# Patient Record
Sex: Female | Born: 1935 | Race: White | Hispanic: No | Marital: Married | Smoking: Never smoker
Health system: Southern US, Community
[De-identification: ages and names within clinical notes are randomized; demographics above are authoritative.]

## PROBLEM LIST (undated history)

## (undated) DIAGNOSIS — F419 Anxiety disorder, unspecified: Secondary | ICD-10-CM

## (undated) DIAGNOSIS — K219 Gastro-esophageal reflux disease without esophagitis: Secondary | ICD-10-CM

## (undated) DIAGNOSIS — F329 Major depressive disorder, single episode, unspecified: Secondary | ICD-10-CM

## (undated) DIAGNOSIS — M1990 Unspecified osteoarthritis, unspecified site: Secondary | ICD-10-CM

## (undated) DIAGNOSIS — D649 Anemia, unspecified: Secondary | ICD-10-CM

## (undated) DIAGNOSIS — I8390 Asymptomatic varicose veins of unspecified lower extremity: Secondary | ICD-10-CM

## (undated) DIAGNOSIS — E119 Type 2 diabetes mellitus without complications: Secondary | ICD-10-CM

## (undated) DIAGNOSIS — E785 Hyperlipidemia, unspecified: Secondary | ICD-10-CM

## (undated) DIAGNOSIS — T7840XA Allergy, unspecified, initial encounter: Secondary | ICD-10-CM

## (undated) DIAGNOSIS — K591 Functional diarrhea: Secondary | ICD-10-CM

## (undated) DIAGNOSIS — M79605 Pain in left leg: Secondary | ICD-10-CM

## (undated) DIAGNOSIS — E559 Vitamin D deficiency, unspecified: Secondary | ICD-10-CM

## (undated) DIAGNOSIS — I10 Essential (primary) hypertension: Secondary | ICD-10-CM

## (undated) DIAGNOSIS — E1142 Type 2 diabetes mellitus with diabetic polyneuropathy: Secondary | ICD-10-CM

## (undated) DIAGNOSIS — R609 Edema, unspecified: Secondary | ICD-10-CM

## (undated) HISTORY — PX: ABDOMINAL HYSTERECTOMY: SHX81

## (undated) HISTORY — DX: Gastro-esophageal reflux disease without esophagitis: K21.9

## (undated) HISTORY — PX: SPINE SURGERY: SHX786

## (undated) HISTORY — DX: Essential (primary) hypertension: I10

## (undated) HISTORY — DX: Type 2 diabetes mellitus without complications: E11.9

## (undated) HISTORY — DX: Asymptomatic varicose veins of unspecified lower extremity: I83.90

## (undated) HISTORY — DX: Anemia, unspecified: D64.9

## (undated) HISTORY — PX: CHOLECYSTECTOMY: SHX55

## (undated) HISTORY — DX: Hyperlipidemia, unspecified: E78.5

## (undated) HISTORY — DX: Major depressive disorder, single episode, unspecified: F32.9

## (undated) HISTORY — DX: Pain in left leg: M79.605

## (undated) HISTORY — DX: Vitamin D deficiency, unspecified: E55.9

## (undated) HISTORY — PX: LASER ABLATION: SHX1947

## (undated) HISTORY — DX: Allergy, unspecified, initial encounter: T78.40XA

## (undated) HISTORY — DX: Type 2 diabetes mellitus with diabetic polyneuropathy: E11.42

## (undated) HISTORY — DX: Unspecified osteoarthritis, unspecified site: M19.90

## (undated) HISTORY — DX: Edema, unspecified: R60.9

## (undated) HISTORY — DX: Anxiety disorder, unspecified: F41.9

## (undated) HISTORY — DX: Functional diarrhea: K59.1

---

## 1999-04-14 ENCOUNTER — Encounter: Admission: RE | Admit: 1999-04-14 | Discharge: 1999-04-14 | Payer: Self-pay | Admitting: Family Medicine

## 1999-04-14 ENCOUNTER — Encounter: Payer: Self-pay | Admitting: Family Medicine

## 1999-04-29 ENCOUNTER — Encounter: Admission: RE | Admit: 1999-04-29 | Discharge: 1999-04-29 | Payer: Self-pay | Admitting: Family Medicine

## 1999-04-29 ENCOUNTER — Encounter: Payer: Self-pay | Admitting: Family Medicine

## 1999-08-12 ENCOUNTER — Encounter: Payer: Self-pay | Admitting: Family Medicine

## 1999-08-12 ENCOUNTER — Encounter: Admission: RE | Admit: 1999-08-12 | Discharge: 1999-08-12 | Payer: Self-pay | Admitting: Family Medicine

## 1999-08-18 ENCOUNTER — Encounter: Admission: RE | Admit: 1999-08-18 | Discharge: 1999-08-18 | Payer: Self-pay | Admitting: Family Medicine

## 1999-08-18 ENCOUNTER — Encounter: Payer: Self-pay | Admitting: Family Medicine

## 1999-09-15 ENCOUNTER — Encounter: Payer: Self-pay | Admitting: Neurosurgery

## 1999-09-19 ENCOUNTER — Encounter: Payer: Self-pay | Admitting: Neurosurgery

## 1999-09-19 ENCOUNTER — Inpatient Hospital Stay (HOSPITAL_COMMUNITY): Admission: RE | Admit: 1999-09-19 | Discharge: 1999-09-20 | Payer: Self-pay | Admitting: Neurosurgery

## 2001-02-20 ENCOUNTER — Encounter: Payer: Self-pay | Admitting: Family Medicine

## 2001-02-20 ENCOUNTER — Encounter: Admission: RE | Admit: 2001-02-20 | Discharge: 2001-02-20 | Payer: Self-pay | Admitting: Family Medicine

## 2001-02-27 ENCOUNTER — Encounter: Payer: Self-pay | Admitting: Family Medicine

## 2001-02-27 ENCOUNTER — Ambulatory Visit (HOSPITAL_COMMUNITY): Admission: RE | Admit: 2001-02-27 | Discharge: 2001-02-27 | Payer: Self-pay | Admitting: Family Medicine

## 2004-12-15 ENCOUNTER — Encounter: Admission: RE | Admit: 2004-12-15 | Discharge: 2004-12-15 | Payer: Self-pay | Admitting: Family Medicine

## 2004-12-25 ENCOUNTER — Emergency Department (HOSPITAL_COMMUNITY): Admission: EM | Admit: 2004-12-25 | Discharge: 2004-12-25 | Payer: Self-pay | Admitting: Emergency Medicine

## 2005-04-03 ENCOUNTER — Ambulatory Visit: Payer: Self-pay | Admitting: Gastroenterology

## 2005-04-05 ENCOUNTER — Ambulatory Visit: Payer: Self-pay | Admitting: Cardiology

## 2005-05-01 ENCOUNTER — Ambulatory Visit: Payer: Self-pay | Admitting: Gastroenterology

## 2005-06-29 ENCOUNTER — Encounter: Admission: RE | Admit: 2005-06-29 | Discharge: 2005-06-29 | Payer: Self-pay | Admitting: Family Medicine

## 2005-11-22 ENCOUNTER — Encounter: Admission: RE | Admit: 2005-11-22 | Discharge: 2005-11-22 | Payer: Self-pay | Admitting: Family Medicine

## 2006-09-23 IMAGING — CT CT ABDOMEN W/ CM
1 of 4 series · 13 of 32 positions shown, 18 images · IV contrast (APPLIED)
Comparison: none

CLINICAL DATA: right upper quadrant pain; feels knot on right near cholecystectomy incision
ABDOMEN CT WITH CONTRAST:
TECHNIQUE: Multidetector CT imaging of the abdomen was performed following the standard protocol during bolus administration of intravenous contrast.
Contrast:  100 cc Omnipaque 300
TECHNIQUE: Multidetector CT imaging of the pelvis was performed following the standard protocol during bolus administration of intravenous contrast.

[Series 2: abd_pel 5.0 b30f st · axial · 0.71mm/px · z∈[-436,-86]mm · 13 of 82 slices shown, 18 images]
[im 6/82  soft-tissue]
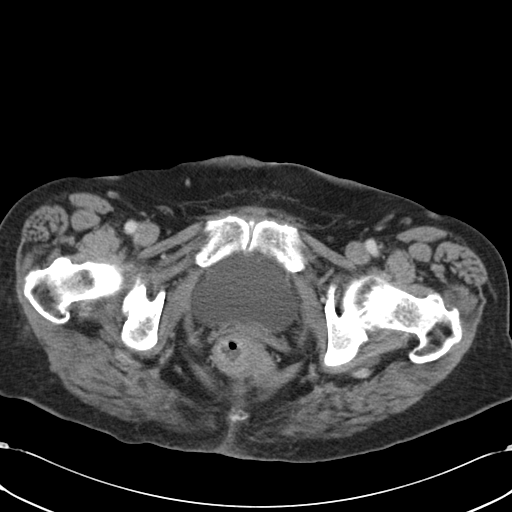
[im 6/82  bone]
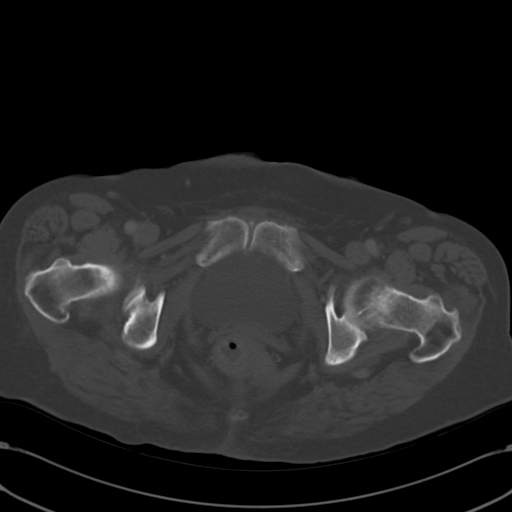
[im 11/82  soft-tissue]
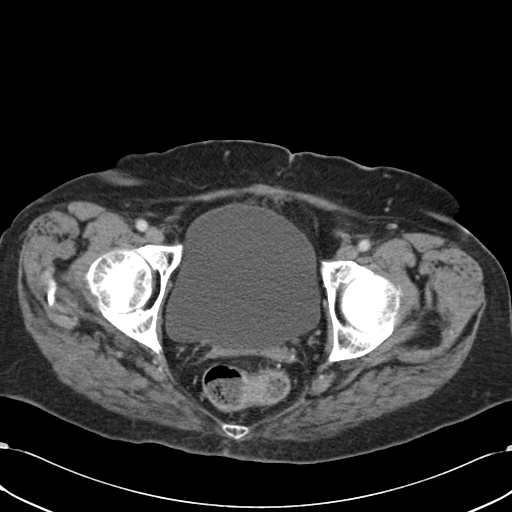
[im 21/82  soft-tissue]
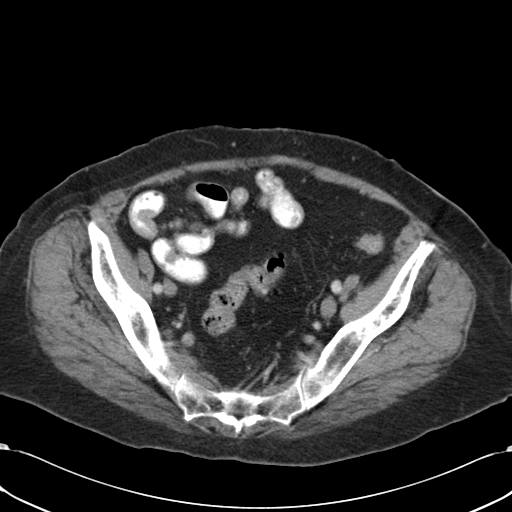
[im 26/82  soft-tissue]
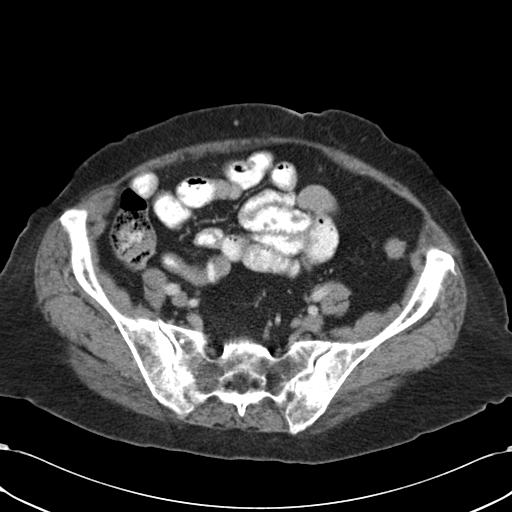
[im 31/82  soft-tissue]
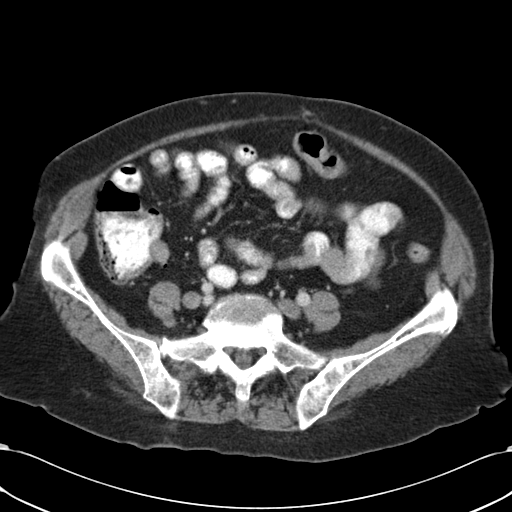
[im 36/82  soft-tissue]
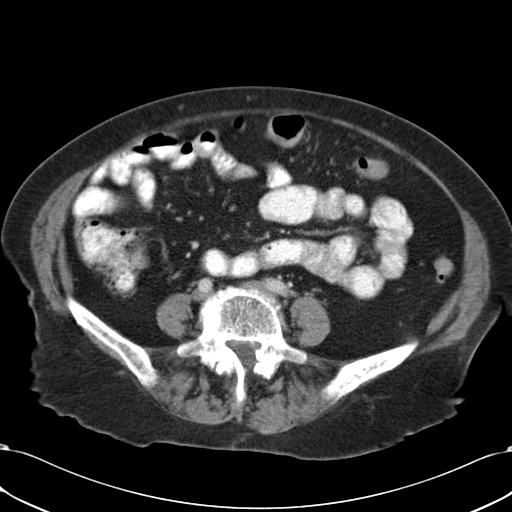
[im 46/82  soft-tissue]
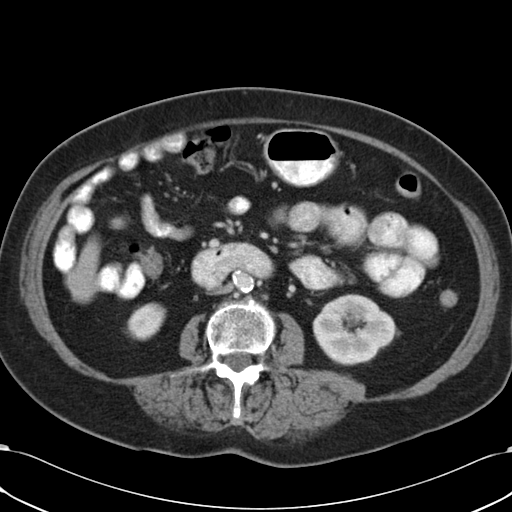
[im 51/82  soft-tissue]
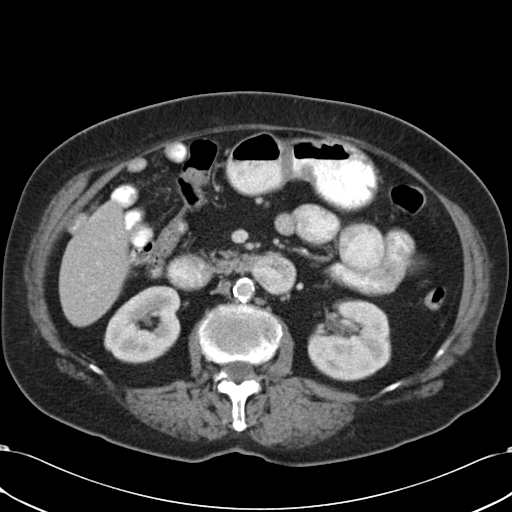
[im 56/82  soft-tissue]
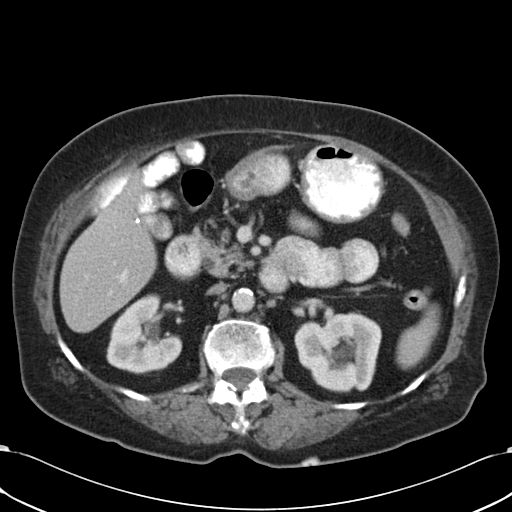
[im 56/82  bone]
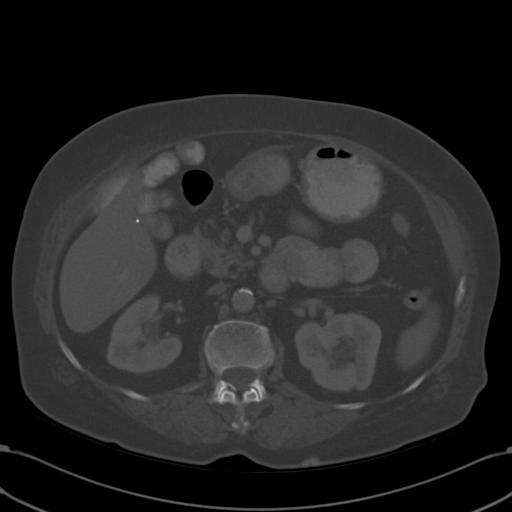
[im 61/82  soft-tissue]
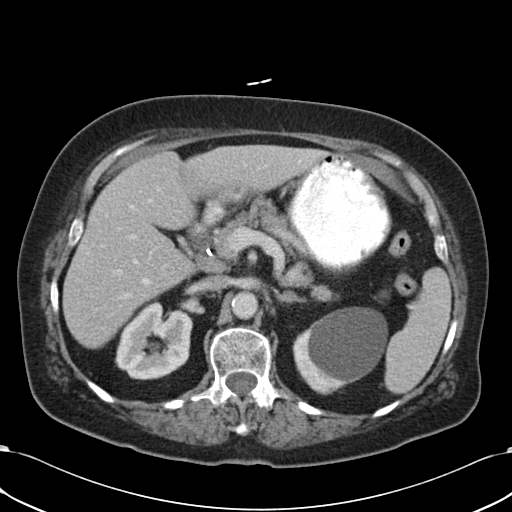
[im 61/82  lung]
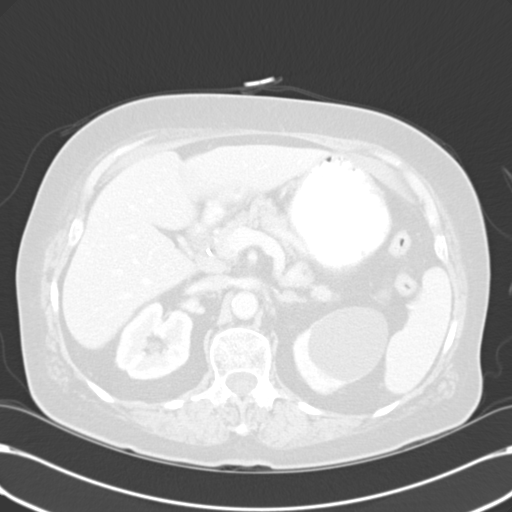
[im 66/82  lung]
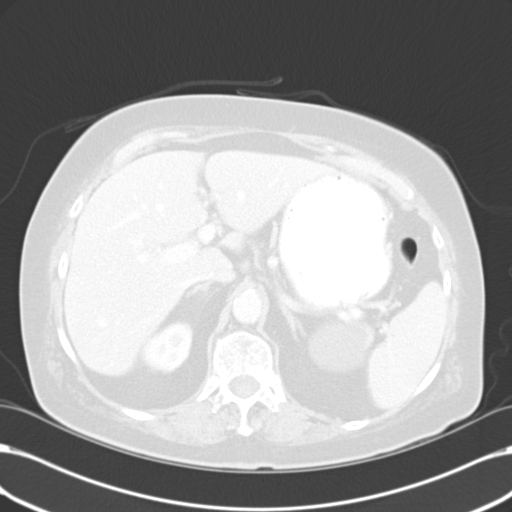
[im 71/82  soft-tissue]
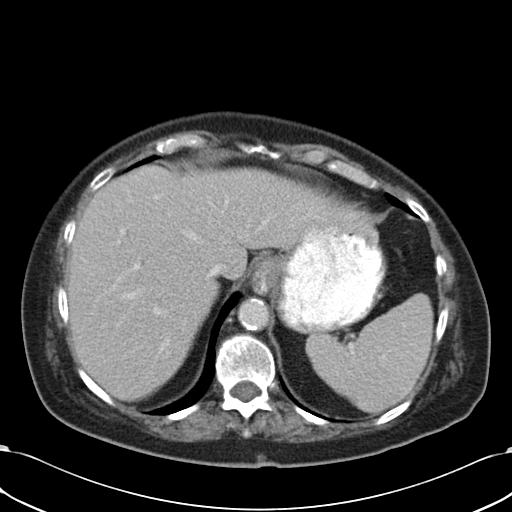
[im 71/82  lung]
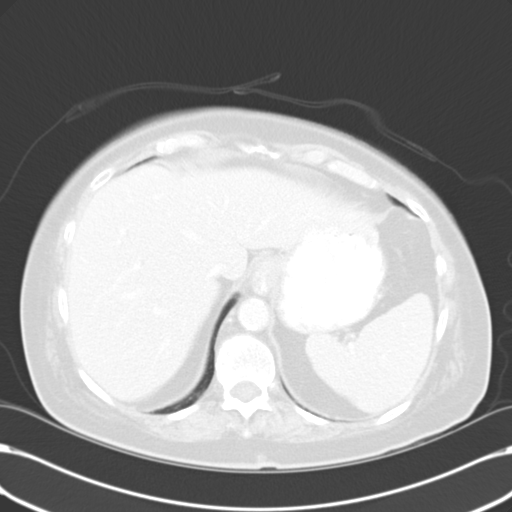
[im 76/82  soft-tissue]
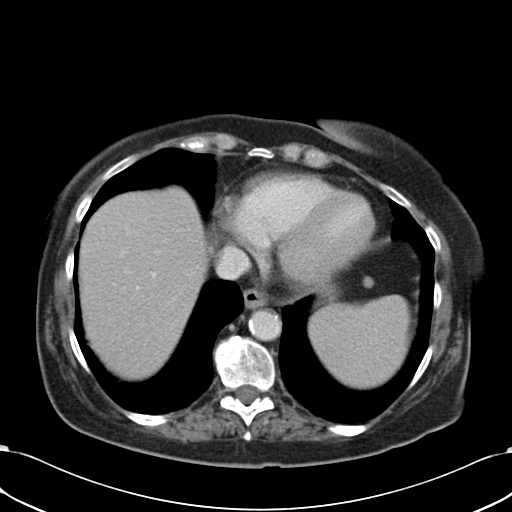
[im 76/82  lung]
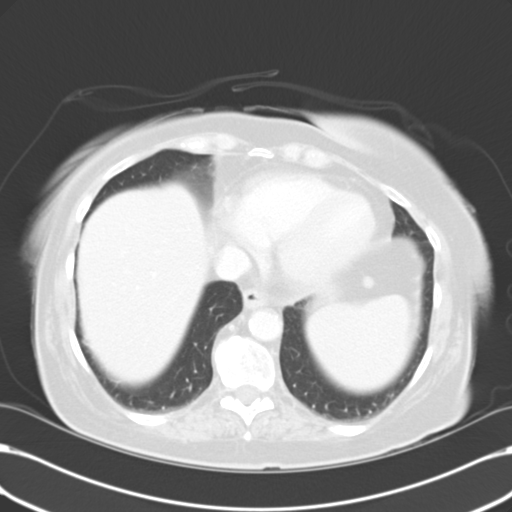

[13 of 32 positions shown; findings below may reference images not displayed]

FINDINGS: The lung bases are clear.  The liver enhances normally with no focal abnormality and no ductal dilatation is seen.  Surgical clips are present from prior cholecystectomy.  The pancreas is normal in size with normal-appearing pancreatic fat planes.  The left adrenal gland is normal with a probable right adrenal adenoma of 21 x 15mm on image # 14 with an attention of 20 Hounsfield units.  The spleen is normal in size.  A 6 x 5cm cyst emanates from the upper pole of the left kidney.  No renal calculi are seen and there is no evidence of hydronephrosis.  The abdominal aorta is normal in caliber.  No adenopathy is noted.
IMPRESSION: 1.  Incidental 21 x 15mm right adrenal adenoma.  
2.  6cm upper pole left renal cyst.  
3.  Prior cholecystectomy.  
PELVIS CT WITH CONTRAST:
FINDINGS: Scans were continued through the pelvis after oral and IV contrast media were given.  The terminal ileum appears normal.  The appendix has previously been removed.  There is minimal prominence of mucosa of distal ileum.  This may simply be due to contraction and be within normal limits, but if further assessment is necessary CT enterography may be helpful.  Urinary bladder is unremarkable.  There are rectosigmoid colonic diverticula present.  No free fluid is seen within the pelvis.
IMPRESSION: 1.  Rectosigmoid colonic diverticula.
2. Slight prominence of the mucosa of distal ileal small bowel of questionable significance possibly due to contraction of small bowel.  If further assessment is necessary suggest CT enterography.

## 2007-05-13 IMAGING — NM NM THYROID UPTAKE SINGLE (24 HR)
1 series · 1 of 1 positions shown · non-contrast
Comparison: none

CLINICAL DATA: 25 to 30 pound weight loss.  TSH 0.137.  Probable multinodular goiter on thyroid ultrasound, 11/22/05.
NUCLEAR MEDICINE THYROID UPTAKE SINGLE ? 45 MINUTES, 11/23/05:
TECHNIQUE: Following orally administered I131, thyroid radioiodine uptake was calculated at 24 hours
Radiopharmaceuticals:  14.25 uCi I131 sodium iodide p.o.

[st statics, dual detec · 1 of 1 slices shown]
[im 1/1]
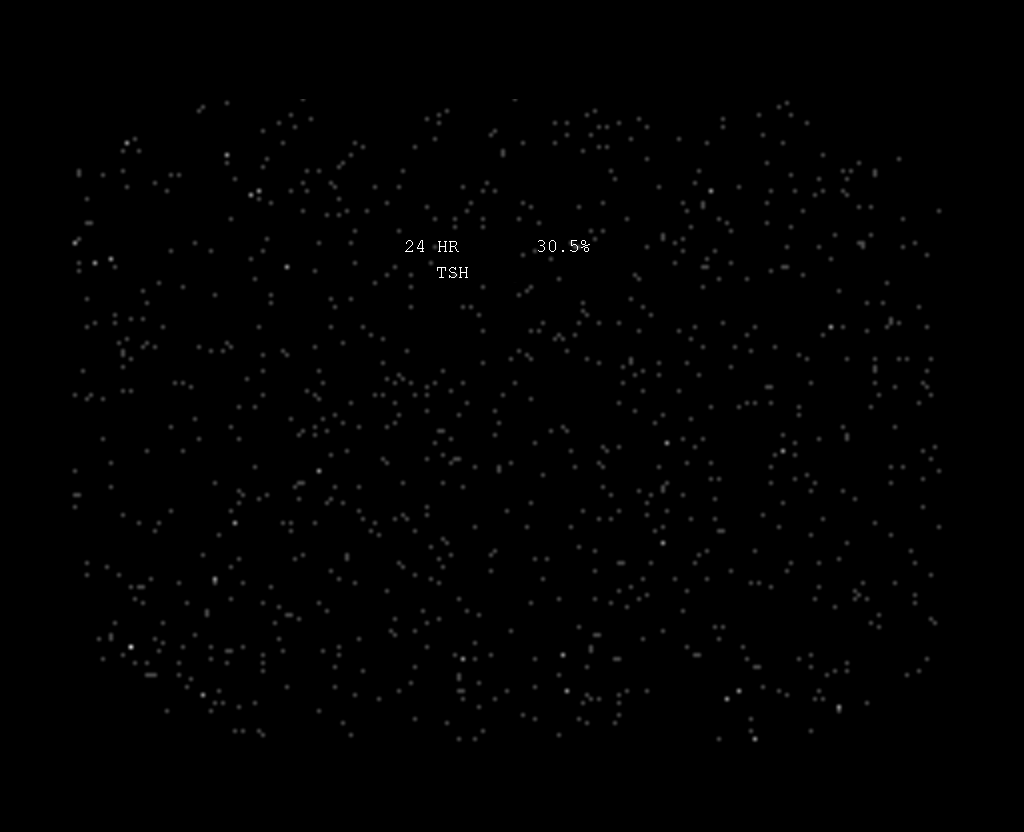

[1 of 1 positions shown; findings below may reference images not displayed]

FINDINGS: 24 hour thyroid uptake calculates to 30.5% which is within upper normal range.
IMPRESSION: 24 hour REGENT thyroid uptake 30.5% (upper normal).

## 2007-12-06 ENCOUNTER — Encounter: Admission: RE | Admit: 2007-12-06 | Discharge: 2007-12-06 | Payer: Self-pay | Admitting: Family Medicine

## 2009-03-09 ENCOUNTER — Encounter (INDEPENDENT_AMBULATORY_CARE_PROVIDER_SITE_OTHER): Payer: Self-pay | Admitting: *Deleted

## 2009-07-19 ENCOUNTER — Telehealth (INDEPENDENT_AMBULATORY_CARE_PROVIDER_SITE_OTHER): Payer: Self-pay | Admitting: *Deleted

## 2010-07-21 NOTE — Progress Notes (Signed)
  Phone Note Other Incoming   Summary of Call: Patient's chart has been requested by [facility] on 07/19/09.  Chart requested to [place] copies/fax area.

## 2010-08-03 DIAGNOSIS — E559 Vitamin D deficiency, unspecified: Secondary | ICD-10-CM

## 2010-08-03 HISTORY — DX: Vitamin D deficiency, unspecified: E55.9

## 2010-11-03 DIAGNOSIS — F32A Depression, unspecified: Secondary | ICD-10-CM

## 2010-11-03 HISTORY — DX: Depression, unspecified: F32.A

## 2010-11-04 NOTE — H&P (Signed)
[place]  Patient:    Catherine Lyons, Catherine Lyons                      MRN: [id number] Adm. Date:  [id number] Attending:  Barton Fanny CC:         Hewitt Shorts, M.D.                         History and Physical  HISTORY OF PRESENT ILLNESS:  The patient is a 77 year old, right-handed white female who was evaluated for a left lumbar radiculopathy.  She had trouble off nd on with her back for 50 or more years.  She had been evaluated 11 years ago by y former partner, Dr. Hope Pigeon.  The patient says that about a month and one-half ago, she had one of her typical episodes of low back discomfort, but then about a month ago, the pain in her low back resolved, but she developed pain down her left lower extremity.  There was no particular cause that brought this about.  The pain went from the left buttock down into the thigh and leg and into the foot, and she found it interesting that she did not have any back pain associated with it. She took Darvocet which has tended to relieve the discomfort, but she has had to limit her activities.  She denies any numbness or paresthesias.  She says she does walk with a limp and has a sense of weakness in the left lower extremity.  The patient was evaluated with MRI scan which showed degenerative disk disease t multiple levels; however, there is a left L4-5 lumbar disk herniation that has migrated caudally behind the body of L5.  It is felt that this most likely represents an acute disk herniation and much less likely represents a synovial cyst.  PAST MEDICAL HISTORY:  Notable for a history of hypertension.  She uses atenolol and Lotensin.  History of diabetes for 20 years treated with Glucotrol XL. History of depression treated with Risperdal and Zoloft.  She does not describe any history of myocardial infarction, cancer, stroke, or lung disease.  She does describe a  history of hiatal  hernia.  PAST SURGICAL HISTORY:  Two cesarean section, D&Cs, cholecystectomy, breast biopsy x 3, hemorrhoid surgery, partial hysterectomy and then total hysterectomy.  ALLERGIES:  She reports allergy to DEMEROL and CODEINE.  CURRENT MEDICATIONS: 1. Atenolol 50 mg q.d. 2. Lotensin 40 mg q.d. 3. Glucotrol XL 10 mg q.d. 4. Premarin 0.625 mg q.d. 5. Zoloft 100 mg q.d. 6. Risperdal 0.5 mg q.h.s. 7. Darvocet-N 100 1 to 2 tablets p.o. q.4h. p.r.n. pain.  FAMILY HISTORY:  Mother died at age 51 of heart disease.  Her father died at age 65 of cancer.  He also had diabetes.  SOCIAL HISTORY:  The patient is married.  She does not work.  She does not smoke, drink alcoholic beverages, or have a history of substance abuse.  REVIEW OF SYSTEMS: Notable for those difficulties described in her History of Present Illness and Past Medical History but is otherwise unremarkable.  PHYSICAL EXAMINATION:  GENERAL:  The patient is a well-developed, well-nourished, white female in no acute distress.  VITAL SIGNS:  Temperature 98.2, pulse 76, blood pressure 174/80, respiratory rate 18.  Height is 5 feet 4 inches, weight 160 pounds.  LUNGS:  Clear to auscultation.  Symmetrical respiratory excursion.  HEART:  Regular rate and rhythm.  Normal S1 and S2.  There is no murmur.  ABDOMEN:  Soft, nontender.  Bowel sounds are present.  EXTREMITIES:  No clubbing, cyanosis, or edema.  VASCULAR:  Pulses 1 to 2 at dorsalis pedis and posterior tibial bilaterally. She has moderate varicosities, particularly in the left lower extremity.  MUSCULOSKELETAL:  No tenderness to palpation over the lumbar spinous process or  perilumbar musculature.  She is limited in forward flexion to about 6 degrees due to discomfort.  She is able to extend fairly well.  Straight leg raising on the  left at about 6 degrees because of radicular pain.  Straight leg raising is negative on the right.  NEUROLOGIC:  Weakness in the  left dorsiflexion at 4-/5, left extensor hallucis longus 2 to 3/5; however the remainder of the lower extremity musculature is 5/5 including the plantar flexors bilaterally as well as the right dorsiflexor and extensor hallucis longus.  Sensation is intact to pinprick in the lower extremities.  Reflexes are 1 at the quadriceps bilaterally.  The left gastrocnemius is absent.  The right gastrocnemius is 2.  Toes are downgoing bilaterally.  She has normal gait and stance.  IMPRESSION:  Acute left L5 radiculopathy related to an acute left L4-5 lumbar disk herniation with a fragment that has migrated caudally behind the body of L5 with associated significant weakness of the left dorsiflexor and extensor hallucis longus.  PLAN:  The patient will be admitted for a left L4-5 lumbar laminotomy and microdiskectomy.  We discussed alternatives to surgery, the typical length of surgery, hospitalization, recuperation, and the nature of the surgical procedure including its risks including the risk of infection, bleeding, possibility of transfusion, risk of nerve dysfunction, pain, weakness, or paresthesias, risk of recurrent disk herniation, possible need for further surgery, anesthetic risks f myocardial infarction, stroke, pneumonia, and death.  Understanding all of this, the patient does wish to proceed with surgery and is admitted for such. DD:  09/19/99 TD:  09/19/99 Job: [id number]

## 2010-11-04 NOTE — Op Note (Signed)
[place]  Patient:    Catherine Lyons, Catherine Lyons                      MRN: [id number] Proc. Date: 09/19/99 Adm. Date:  [id number] Disc. Date: [id number] Attending:  Barton Fanny CC:         Hewitt Shorts, M.D.                           Operative Report  PREOPERATIVE DIAGNOSIS:  Left L4-5 lumbar disk herniation.  POSTOPERATIVE DIAGNOSIS:  Left L4-5 lumbar disk herniation.  PROCEDURE:  Left L4-5 lumbar laminotomy and microdiskectomy.  SURGEON:  Hewitt Shorts, M.D.  ASSISTANT:  Dr. Roxan Hockey.  ANESTHESIA:  General endotracheal.  INDICATIONS:  The patient is a 77 year old woman who presented with a left L5 radiculopathy with significant dorsiflexion and EHL weakness, who was found by RI scan to have a left L4-5 lumbar disk herniation with a fragmented that had migrated caudally behind the body of the L5.  The decision was made to proceed with elective laminotomy and microdiskectomy.  DESCRIPTION OF PROCEDURE:  The patient was brought to the operating room and placed under general endotracheal anesthesia.  The patient was turned to a prone position and the lumbar region was prepped with Betadine soap solution, draped in a sterile fashion.  The midline was infiltrated with local anesthetic with epinephrine and local are was taped and the L4-5 level identified.  A midline incision was made, carried down through the subcutaneous tissue.  Bipolar cautery and electrocautery used to maintain hemostasis.  Dissection was carried down to the lumbar fascia which was incised from the left side of the midline in the paraspinal muscle with dissection of the spinous process and lamina in a subperiosteal fashion.  The L4-5 intralaminar space was identified and actually was taken to confirm this localization, and I proceeded with a laminotomy using the Midas Rex drill with  G12 130 bit and attachment for the Midas Rex and Kerrison punches.   The microscope was draped and brought into the field to provide identification, illumination and visualization and the remainder of the procedure was performed using microdissection technique.  The laminotomy was extended rostrally and caudally nd we were able to expose the lateral aspect of the thecal sac and the left L5 nerve root.  We first exposed in the axilla of the left L5 nerve root and found several fragments of degenerated disk material, which were removed and freed up. Additional fragments we then mobilized and removed.  We then exposed rostral to the nerve roots and further fragments were encountered and we could see where the annulus had been separated from the superior aspect of the L5 vertebral body. e entered the disk space and removed all loose fragments of the disk material from within the disk space and then the epidural space was more carefully examined to ensure that all fragments had been removed and that the thecal sac and nerve root were well decompressed.  In the end, all loose fragment disk material were removed from both disk space and the epidural space, and nerve root and thecal sac were  felt to be well decompressed; hemostasis was established with the use of bipolar cautery as well as Gelfoam soaked in thrombin; however, all the Gelfoam was removed prior to closure.  Once hemostasis established, we infused 2 cc of fentanyl and 80 mg of Depo-Medrol into the epidural  space and proceeded with closure.  The deep  fascia was closed with interrupted undyed 0 Vicryl sutures and the subcutaneous and subcuticular were closed with interrupted inverted 2-0 Vicryl sutures, then the  skin edges were approximately with Dermabond.  Following surgery, the patient was turned back to the supine position to be reversed from the anesthetic, extubated and was transferred to the recovery room for further care.  Estimated blood loss was less than 100 cc.  Sponge  and count were correct. DD:  09/19/99 TD:  09/19/99 Job: [id number]

## 2011-10-02 DIAGNOSIS — T7840XA Allergy, unspecified, initial encounter: Secondary | ICD-10-CM

## 2011-10-02 DIAGNOSIS — R609 Edema, unspecified: Secondary | ICD-10-CM

## 2011-10-02 DIAGNOSIS — E1142 Type 2 diabetes mellitus with diabetic polyneuropathy: Secondary | ICD-10-CM

## 2011-10-02 HISTORY — DX: Type 2 diabetes mellitus with diabetic polyneuropathy: E11.42

## 2011-10-02 HISTORY — DX: Edema, unspecified: R60.9

## 2011-10-02 HISTORY — DX: Allergy, unspecified, initial encounter: T78.40XA

## 2012-01-25 DIAGNOSIS — I10 Essential (primary) hypertension: Secondary | ICD-10-CM

## 2012-01-25 HISTORY — DX: Essential (primary) hypertension: I10

## 2012-04-30 DIAGNOSIS — K591 Functional diarrhea: Secondary | ICD-10-CM

## 2012-04-30 DIAGNOSIS — K219 Gastro-esophageal reflux disease without esophagitis: Secondary | ICD-10-CM

## 2012-04-30 DIAGNOSIS — M1990 Unspecified osteoarthritis, unspecified site: Secondary | ICD-10-CM

## 2012-04-30 HISTORY — DX: Functional diarrhea: K59.1

## 2012-04-30 HISTORY — DX: Unspecified osteoarthritis, unspecified site: M19.90

## 2012-04-30 HISTORY — DX: Gastro-esophageal reflux disease without esophagitis: K21.9

## 2012-05-10 DIAGNOSIS — E785 Hyperlipidemia, unspecified: Secondary | ICD-10-CM

## 2012-05-10 DIAGNOSIS — E119 Type 2 diabetes mellitus without complications: Secondary | ICD-10-CM

## 2012-05-10 HISTORY — DX: Hyperlipidemia, unspecified: E78.5

## 2012-05-10 HISTORY — DX: Type 2 diabetes mellitus without complications: E11.9

## 2012-07-20 DIAGNOSIS — M79605 Pain in left leg: Secondary | ICD-10-CM

## 2012-07-20 HISTORY — DX: Pain in left leg: M79.605

## 2012-08-16 ENCOUNTER — Other Ambulatory Visit: Payer: Self-pay | Admitting: *Deleted

## 2012-09-06 ENCOUNTER — Encounter: Payer: Self-pay | Admitting: Surgery

## 2012-09-09 ENCOUNTER — Encounter: Payer: Self-pay | Admitting: Surgery

## 2012-10-18 ENCOUNTER — Encounter: Payer: Self-pay | Admitting: Vascular Surgery

## 2012-10-28 ENCOUNTER — Encounter: Payer: Self-pay | Admitting: Surgery

## 2012-10-30 ENCOUNTER — Encounter: Payer: Self-pay | Admitting: Vascular Surgery

## 2012-10-31 ENCOUNTER — Ambulatory Visit (INDEPENDENT_AMBULATORY_CARE_PROVIDER_SITE_OTHER): Payer: Medicare Other | Admitting: Vascular Surgery

## 2012-10-31 ENCOUNTER — Encounter: Payer: Self-pay | Admitting: Vascular Surgery

## 2012-10-31 ENCOUNTER — Encounter (INDEPENDENT_AMBULATORY_CARE_PROVIDER_SITE_OTHER): Payer: Medicare Other | Admitting: *Deleted

## 2012-10-31 VITALS — BP 166/60 | HR 69 | Resp 16 | Ht 65.0 in | Wt 154.0 lb

## 2012-10-31 DIAGNOSIS — I83893 Varicose veins of bilateral lower extremities with other complications: Secondary | ICD-10-CM

## 2012-10-31 DIAGNOSIS — M79609 Pain in unspecified limb: Secondary | ICD-10-CM

## 2012-10-31 NOTE — Progress Notes (Signed)
[facility] [place]   Patient name: Catherine Lyons MRN: [id number] DOB: [birth year] Sex: female   Referred by: [place]  Reason for referral:  Chief Complaint  Patient presents with  . Varicose Veins    NEW B/L VV PAIN, SWELLING    HISTORY OF PRESENT ILLNESS: The patient is a very active 79 year old white female with a history of prior laser ablation of her left great saphenous vein an outlying facility approximately 8 years ago. She reportedly at that time had large varicosities and had a good result. Over the past 2 months she began having pain in her left leg. This was quite uncomfortable to her and would awaken her at night and she had a very difficult time achieving any rest. She tried multiple home remedies and elevation with some improvement. She does report that A. aspirin product has improved her discomfort. At its worse she had 3 kn were buried for sleep. She reports this pain is now nearly completely resolved. He has very mild discomfort in the right leg. She reports this as an aching sensation throughout her entire leg and not specifically in the joint spaces themselves. She does have what sounds like neuropathy in her feet with pins and needles sensation chronically. He does not have any history of lower surety extremity tissue loss or venous ulceration. She does have degenerative disc disease with prior back procedures.  Past Medical History  Diagnosis Date  . Left leg pain Feb. 2014    and swelling  . Varicose veins     Left leg - pain and swelling  . Anxiety   . Depression 11/03/10  . Esophageal reflux 04/30/12  . Diabetes mellitus without complication 05/10/12  . Hyperlipidemia 05/10/12  . Functional diarrhea 04/30/12  . Allergy 10/02/11    Rhinitis  . Vitamin D deficiency 08/03/10  . Hypertension 01/25/12  . Edema 10/02/11  . Arthritis 04/30/12    Gout  . Polyneuropathy in diabetes 10/02/11  . Anemia 02/08/12 and 05/10/12    Past  Surgical History  Procedure Laterality Date  . Laser ablation Left     Years ago- Large varicosity   . Cholecystectomy    . Abdominal hysterectomy    . Spine surgery      Lumbar  spine X's 2    History   Social History  . Marital Status: Married    Spouse Name: N/A    Number of Children: N/A  . Years of Education: N/A   Occupational History  . Not on file.   Social History Main Topics  . Smoking status: Never Smoker   . Smokeless tobacco: Never Used  . Alcohol Use: No  . Drug Use: No  . Sexually Active: Not on file   Other Topics Concern  . Not on file   Social History Narrative  . No narrative on file    Family History  Problem Relation Age of Onset  . Heart disease Mother   . Diabetes Father     Allergies as of 10/31/2012 - Review Complete 10/31/2012  Allergen Reaction Noted  . Codeine sulfate Rash 10/18/2012  . Demerol (meperidine) Rash 10/18/2012    Current Outpatient Prescriptions on File Prior to Visit  Medication Sig Dispense Refill  . allopurinol (ZYLOPRIM) 100 MG tablet Take 100 mg by mouth daily.      Marland Kitchen ALPRAZolam (XANAX) 0.25 MG tablet Take 0.25 mg by mouth at bedtime as needed for sleep.      Marland Kitchen amLODipine (  NORVASC) 2.5 MG tablet Take 2.5 mg by mouth daily.      . beclomethasone (QVAR) 80 MCG/ACT inhaler Inhale 1 puff into the lungs as needed.      . cholestyramine (QUESTRAN) 4 G packet Take 1 packet by mouth 3 (three) times daily with meals.      Marland Kitchen FLUoxetine (PROZAC) 20 MG capsule Take 20 mg by mouth daily.      . furosemide (LASIX) 20 MG tablet Take 20 mg by mouth 2 (two) times daily.      Marland Kitchen glipiZIDE (GLUCOTROL) 10 MG tablet Take 10 mg by mouth 2 (two) times daily before a meal.      . hydrochlorothiazide (HYDRODIURIL) 25 MG tablet Take 25 mg by mouth daily.      Marland Kitchen LOSARTAN POTASSIUM PO Take 100 mg by mouth daily.      . meclizine (ANTIVERT) 25 MG tablet Take 25 mg by mouth 3 (three) times daily as needed.      . metFORMIN (GLUCOPHAGE) 500 MG  tablet Take 500 mg by mouth 2 (two) times daily with a meal.      . pioglitazone (ACTOS) 15 MG tablet Take 15 mg by mouth daily.      . pravastatin (PRAVACHOL) 40 MG tablet Take 40 mg by mouth daily.      . ranitidine (ZANTAC) 150 MG capsule Take 150 mg by mouth 2 (two) times daily.       No current facility-administered medications on file prior to visit.     REVIEW OF SYSTEMS:  Positives indicated with an "X"  CARDIOVASCULAR:  [ ]  chest pain   [ ]  chest pressure   [ ]  palpitations   [ ]  orthopnea   [ ]  dyspnea on exertion   [ ]  claudication   [x ] rest pain   [ ]  DVT   [x ] phlebitis PULMONARY:   [x ] productive cough   [ ]  asthma   [ ]  wheezing NEUROLOGIC:   [x ] weakness  [x ] paresthesias  [ ]  aphasia  [ ]  amaurosis  [x ] dizziness HEMATOLOGIC:   [ ]  bleeding problems   [ ]  clotting disorders MUSCULOSKELETAL:  [ ]  joint pain   [ ]  joint swelling GASTROINTESTINAL: [ ]   blood in stool  [ ]   hematemesis GENITOURINARY:  [ ]   dysuria  [ ]   hematuria PSYCHIATRIC:  [ ]  history of major depression INTEGUMENTARY:  [ ]  rashes  [ ]  ulcers CONSTITUTIONAL:  [ ]  fever   [ ]  chills  PHYSICAL EXAMINATION:  General: The patient is a well-nourished female, in no acute distress. Vital signs are BP 166/60  Pulse 69  Resp 16  Ht 5\' 5"  (1.651 m)  Wt 154 lb (69.854 kg)  BMI 25.63 kg/m2 Pulmonary: There is a good air exchange bilaterally  Abdomen: Soft and non-tender  Musculoskeletal: There are no major deformities.  There is no significant extremity tenderness Neurologic: No focal weakness or paresthesias are detected, Skin: There are no ulcer or rashes noted. Psychiatric: The patient has normal affect. Pulse status: 2+ radial and 2+ dorsalis pedis pulses bilaterally Very few scattered tributary varicosities over both legs with no evidence of phlebitis or venous hypertension   VVS Vascular Lab Studies:  Ordered and Independently Reviewed left leg venous duplex revealed no evidence of DVT.  Areas mild reflux in the left common femoral vein. There is closure of the great saphenous vein throughout its course with no evidence of recanalization.  She did have a CT of 4 m evaluate regarding her lumbar disc films. This does show extensive aortic calcification with no evidence of aneurysm. She does have significant bone spurs on her spine.  Impression and Plan:  Long discussion with the patient and her husband present. I do not see any evidence of any arterial or venous pathology with that would explain her recent discomfort. Fortunately this has markedly improved. Since she has had a durable result from her left leg vein ablation and does not appear to have any symptoms related to venous or arterial pathology. She was relieved with this discussion will see [place] again on an as-needed basis    EARLY, TODD [facility] [place] Office: [phone]

## 2014-06-23 DIAGNOSIS — D126 Benign neoplasm of colon, unspecified: Secondary | ICD-10-CM | POA: Diagnosis not present

## 2014-06-23 DIAGNOSIS — Z79899 Other long term (current) drug therapy: Secondary | ICD-10-CM | POA: Diagnosis not present

## 2014-06-23 DIAGNOSIS — Z86010 Personal history of colonic polyps: Secondary | ICD-10-CM | POA: Diagnosis not present

## 2014-06-23 DIAGNOSIS — D500 Iron deficiency anemia secondary to blood loss (chronic): Secondary | ICD-10-CM | POA: Diagnosis not present

## 2014-06-23 DIAGNOSIS — K449 Diaphragmatic hernia without obstruction or gangrene: Secondary | ICD-10-CM | POA: Diagnosis not present

## 2014-06-23 DIAGNOSIS — K5730 Diverticulosis of large intestine without perforation or abscess without bleeding: Secondary | ICD-10-CM | POA: Diagnosis not present

## 2014-06-23 DIAGNOSIS — D127 Benign neoplasm of rectosigmoid junction: Secondary | ICD-10-CM | POA: Diagnosis not present

## 2014-06-23 DIAGNOSIS — E119 Type 2 diabetes mellitus without complications: Secondary | ICD-10-CM | POA: Diagnosis not present

## 2014-06-23 DIAGNOSIS — D124 Benign neoplasm of descending colon: Secondary | ICD-10-CM | POA: Diagnosis not present

## 2014-06-23 DIAGNOSIS — D509 Iron deficiency anemia, unspecified: Secondary | ICD-10-CM | POA: Diagnosis not present

## 2014-06-23 DIAGNOSIS — I10 Essential (primary) hypertension: Secondary | ICD-10-CM | POA: Diagnosis not present

## 2014-06-23 DIAGNOSIS — D122 Benign neoplasm of ascending colon: Secondary | ICD-10-CM | POA: Diagnosis not present

## 2014-06-30 DIAGNOSIS — Z79899 Other long term (current) drug therapy: Secondary | ICD-10-CM | POA: Diagnosis not present

## 2014-06-30 DIAGNOSIS — E119 Type 2 diabetes mellitus without complications: Secondary | ICD-10-CM | POA: Diagnosis not present

## 2014-06-30 DIAGNOSIS — M109 Gout, unspecified: Secondary | ICD-10-CM | POA: Diagnosis not present

## 2014-06-30 DIAGNOSIS — E785 Hyperlipidemia, unspecified: Secondary | ICD-10-CM | POA: Diagnosis not present

## 2014-07-21 DIAGNOSIS — I10 Essential (primary) hypertension: Secondary | ICD-10-CM | POA: Diagnosis not present

## 2014-08-04 DIAGNOSIS — M79604 Pain in right leg: Secondary | ICD-10-CM | POA: Diagnosis not present

## 2014-08-04 DIAGNOSIS — M545 Low back pain: Secondary | ICD-10-CM | POA: Diagnosis not present

## 2014-08-25 DIAGNOSIS — S80911A Unspecified superficial injury of right knee, initial encounter: Secondary | ICD-10-CM | POA: Diagnosis not present

## 2014-08-25 DIAGNOSIS — E780 Pure hypercholesterolemia: Secondary | ICD-10-CM | POA: Diagnosis not present

## 2014-08-25 DIAGNOSIS — S8991XA Unspecified injury of right lower leg, initial encounter: Secondary | ICD-10-CM | POA: Diagnosis not present

## 2014-08-25 DIAGNOSIS — I10 Essential (primary) hypertension: Secondary | ICD-10-CM | POA: Diagnosis not present

## 2014-08-25 DIAGNOSIS — S9031XA Contusion of right foot, initial encounter: Secondary | ICD-10-CM | POA: Diagnosis not present

## 2014-08-25 DIAGNOSIS — E118 Type 2 diabetes mellitus with unspecified complications: Secondary | ICD-10-CM | POA: Diagnosis not present

## 2014-08-25 DIAGNOSIS — S93401A Sprain of unspecified ligament of right ankle, initial encounter: Secondary | ICD-10-CM | POA: Diagnosis not present

## 2014-08-25 DIAGNOSIS — S80211A Abrasion, right knee, initial encounter: Secondary | ICD-10-CM | POA: Diagnosis not present

## 2014-08-27 DIAGNOSIS — Z961 Presence of intraocular lens: Secondary | ICD-10-CM | POA: Diagnosis not present

## 2014-08-27 DIAGNOSIS — H3531 Nonexudative age-related macular degeneration: Secondary | ICD-10-CM | POA: Diagnosis not present

## 2014-08-27 DIAGNOSIS — E119 Type 2 diabetes mellitus without complications: Secondary | ICD-10-CM | POA: Diagnosis not present

## 2014-08-28 DIAGNOSIS — H26493 Other secondary cataract, bilateral: Secondary | ICD-10-CM | POA: Diagnosis not present

## 2014-09-01 DIAGNOSIS — I10 Essential (primary) hypertension: Secondary | ICD-10-CM | POA: Diagnosis not present

## 2014-09-01 DIAGNOSIS — S93401A Sprain of unspecified ligament of right ankle, initial encounter: Secondary | ICD-10-CM | POA: Diagnosis not present

## 2014-09-30 DIAGNOSIS — H26492 Other secondary cataract, left eye: Secondary | ICD-10-CM | POA: Diagnosis not present

## 2014-10-02 DIAGNOSIS — D649 Anemia, unspecified: Secondary | ICD-10-CM | POA: Diagnosis not present

## 2014-10-05 DIAGNOSIS — D500 Iron deficiency anemia secondary to blood loss (chronic): Secondary | ICD-10-CM | POA: Diagnosis not present

## 2014-10-06 DIAGNOSIS — D509 Iron deficiency anemia, unspecified: Secondary | ICD-10-CM | POA: Diagnosis not present

## 2014-10-06 DIAGNOSIS — E785 Hyperlipidemia, unspecified: Secondary | ICD-10-CM | POA: Diagnosis not present

## 2014-10-06 DIAGNOSIS — Z79899 Other long term (current) drug therapy: Secondary | ICD-10-CM | POA: Diagnosis not present

## 2014-10-06 DIAGNOSIS — M109 Gout, unspecified: Secondary | ICD-10-CM | POA: Diagnosis not present

## 2014-10-06 DIAGNOSIS — E119 Type 2 diabetes mellitus without complications: Secondary | ICD-10-CM | POA: Diagnosis not present

## 2014-10-06 DIAGNOSIS — M5136 Other intervertebral disc degeneration, lumbar region: Secondary | ICD-10-CM | POA: Diagnosis not present

## 2014-10-06 DIAGNOSIS — I10 Essential (primary) hypertension: Secondary | ICD-10-CM | POA: Diagnosis not present

## 2014-10-08 DIAGNOSIS — E119 Type 2 diabetes mellitus without complications: Secondary | ICD-10-CM | POA: Diagnosis not present

## 2014-10-27 DIAGNOSIS — I10 Essential (primary) hypertension: Secondary | ICD-10-CM | POA: Diagnosis not present

## 2014-11-17 DIAGNOSIS — I10 Essential (primary) hypertension: Secondary | ICD-10-CM | POA: Diagnosis not present

## 2015-02-04 DIAGNOSIS — J40 Bronchitis, not specified as acute or chronic: Secondary | ICD-10-CM | POA: Diagnosis not present

## 2015-02-04 DIAGNOSIS — Z79899 Other long term (current) drug therapy: Secondary | ICD-10-CM | POA: Diagnosis not present

## 2015-02-04 DIAGNOSIS — M5136 Other intervertebral disc degeneration, lumbar region: Secondary | ICD-10-CM | POA: Diagnosis not present

## 2015-02-04 DIAGNOSIS — E119 Type 2 diabetes mellitus without complications: Secondary | ICD-10-CM | POA: Diagnosis not present

## 2015-02-04 DIAGNOSIS — E785 Hyperlipidemia, unspecified: Secondary | ICD-10-CM | POA: Diagnosis not present

## 2015-02-04 DIAGNOSIS — M109 Gout, unspecified: Secondary | ICD-10-CM | POA: Diagnosis not present

## 2015-02-04 DIAGNOSIS — M7989 Other specified soft tissue disorders: Secondary | ICD-10-CM | POA: Diagnosis not present

## 2015-02-04 DIAGNOSIS — I10 Essential (primary) hypertension: Secondary | ICD-10-CM | POA: Diagnosis not present

## 2015-03-04 DIAGNOSIS — I10 Essential (primary) hypertension: Secondary | ICD-10-CM | POA: Diagnosis not present

## 2015-03-04 DIAGNOSIS — M109 Gout, unspecified: Secondary | ICD-10-CM | POA: Diagnosis not present

## 2015-03-04 DIAGNOSIS — E785 Hyperlipidemia, unspecified: Secondary | ICD-10-CM | POA: Diagnosis not present

## 2015-03-04 DIAGNOSIS — D509 Iron deficiency anemia, unspecified: Secondary | ICD-10-CM | POA: Diagnosis not present

## 2015-03-04 DIAGNOSIS — M5136 Other intervertebral disc degeneration, lumbar region: Secondary | ICD-10-CM | POA: Diagnosis not present

## 2015-03-04 DIAGNOSIS — E119 Type 2 diabetes mellitus without complications: Secondary | ICD-10-CM | POA: Diagnosis not present

## 2015-04-08 DIAGNOSIS — Z23 Encounter for immunization: Secondary | ICD-10-CM | POA: Diagnosis not present

## 2015-04-26 DIAGNOSIS — D631 Anemia in chronic kidney disease: Secondary | ICD-10-CM | POA: Diagnosis not present

## 2015-04-26 DIAGNOSIS — N189 Chronic kidney disease, unspecified: Secondary | ICD-10-CM | POA: Diagnosis not present

## 2015-04-27 DIAGNOSIS — D631 Anemia in chronic kidney disease: Secondary | ICD-10-CM | POA: Diagnosis not present

## 2015-04-27 DIAGNOSIS — N189 Chronic kidney disease, unspecified: Secondary | ICD-10-CM | POA: Diagnosis not present

## 2015-05-26 DIAGNOSIS — D509 Iron deficiency anemia, unspecified: Secondary | ICD-10-CM | POA: Diagnosis not present

## 2015-06-02 DIAGNOSIS — H353190 Nonexudative age-related macular degeneration, unspecified eye, stage unspecified: Secondary | ICD-10-CM | POA: Diagnosis not present

## 2015-06-02 DIAGNOSIS — Z961 Presence of intraocular lens: Secondary | ICD-10-CM | POA: Diagnosis not present

## 2015-06-03 DIAGNOSIS — E1165 Type 2 diabetes mellitus with hyperglycemia: Secondary | ICD-10-CM | POA: Diagnosis not present

## 2015-06-03 DIAGNOSIS — I10 Essential (primary) hypertension: Secondary | ICD-10-CM | POA: Diagnosis not present

## 2015-06-03 DIAGNOSIS — D509 Iron deficiency anemia, unspecified: Secondary | ICD-10-CM | POA: Diagnosis not present

## 2015-06-03 DIAGNOSIS — J40 Bronchitis, not specified as acute or chronic: Secondary | ICD-10-CM | POA: Diagnosis not present

## 2015-06-03 DIAGNOSIS — E785 Hyperlipidemia, unspecified: Secondary | ICD-10-CM | POA: Diagnosis not present

## 2015-06-03 DIAGNOSIS — M5136 Other intervertebral disc degeneration, lumbar region: Secondary | ICD-10-CM | POA: Diagnosis not present

## 2015-06-03 DIAGNOSIS — Z79899 Other long term (current) drug therapy: Secondary | ICD-10-CM | POA: Diagnosis not present

## 2015-06-25 DIAGNOSIS — D509 Iron deficiency anemia, unspecified: Secondary | ICD-10-CM | POA: Diagnosis not present

## 2015-07-27 DIAGNOSIS — N189 Chronic kidney disease, unspecified: Secondary | ICD-10-CM | POA: Diagnosis not present

## 2015-07-27 DIAGNOSIS — D631 Anemia in chronic kidney disease: Secondary | ICD-10-CM | POA: Diagnosis not present

## 2015-07-28 DIAGNOSIS — N189 Chronic kidney disease, unspecified: Secondary | ICD-10-CM | POA: Diagnosis not present

## 2015-07-28 DIAGNOSIS — D631 Anemia in chronic kidney disease: Secondary | ICD-10-CM | POA: Diagnosis not present

## 2015-08-03 DIAGNOSIS — I10 Essential (primary) hypertension: Secondary | ICD-10-CM | POA: Diagnosis not present

## 2015-08-03 DIAGNOSIS — M109 Gout, unspecified: Secondary | ICD-10-CM | POA: Diagnosis not present

## 2015-08-03 DIAGNOSIS — E119 Type 2 diabetes mellitus without complications: Secondary | ICD-10-CM | POA: Diagnosis not present

## 2015-08-03 DIAGNOSIS — R2689 Other abnormalities of gait and mobility: Secondary | ICD-10-CM | POA: Diagnosis not present

## 2015-08-03 DIAGNOSIS — M7989 Other specified soft tissue disorders: Secondary | ICD-10-CM | POA: Diagnosis not present

## 2015-08-03 DIAGNOSIS — M5136 Other intervertebral disc degeneration, lumbar region: Secondary | ICD-10-CM | POA: Diagnosis not present

## 2015-08-03 DIAGNOSIS — D509 Iron deficiency anemia, unspecified: Secondary | ICD-10-CM | POA: Diagnosis not present

## 2015-08-09 DIAGNOSIS — M7989 Other specified soft tissue disorders: Secondary | ICD-10-CM | POA: Diagnosis not present

## 2015-08-24 DIAGNOSIS — D509 Iron deficiency anemia, unspecified: Secondary | ICD-10-CM | POA: Diagnosis not present

## 2015-08-27 DIAGNOSIS — M7989 Other specified soft tissue disorders: Secondary | ICD-10-CM | POA: Diagnosis not present

## 2015-08-27 DIAGNOSIS — I10 Essential (primary) hypertension: Secondary | ICD-10-CM | POA: Diagnosis not present

## 2015-08-27 DIAGNOSIS — M5136 Other intervertebral disc degeneration, lumbar region: Secondary | ICD-10-CM | POA: Diagnosis not present

## 2015-08-27 DIAGNOSIS — E119 Type 2 diabetes mellitus without complications: Secondary | ICD-10-CM | POA: Diagnosis not present

## 2015-09-03 DIAGNOSIS — R2689 Other abnormalities of gait and mobility: Secondary | ICD-10-CM | POA: Diagnosis not present

## 2015-09-03 DIAGNOSIS — M5136 Other intervertebral disc degeneration, lumbar region: Secondary | ICD-10-CM | POA: Diagnosis not present

## 2015-09-03 DIAGNOSIS — M6281 Muscle weakness (generalized): Secondary | ICD-10-CM | POA: Diagnosis not present

## 2015-09-24 DIAGNOSIS — D509 Iron deficiency anemia, unspecified: Secondary | ICD-10-CM | POA: Diagnosis not present

## 2015-09-28 DIAGNOSIS — E1165 Type 2 diabetes mellitus with hyperglycemia: Secondary | ICD-10-CM | POA: Diagnosis not present

## 2015-09-28 DIAGNOSIS — E785 Hyperlipidemia, unspecified: Secondary | ICD-10-CM | POA: Diagnosis not present

## 2015-09-28 DIAGNOSIS — M5136 Other intervertebral disc degeneration, lumbar region: Secondary | ICD-10-CM | POA: Diagnosis not present

## 2015-09-28 DIAGNOSIS — Z79899 Other long term (current) drug therapy: Secondary | ICD-10-CM | POA: Diagnosis not present

## 2015-09-28 DIAGNOSIS — I10 Essential (primary) hypertension: Secondary | ICD-10-CM | POA: Diagnosis not present

## 2015-09-28 DIAGNOSIS — J301 Allergic rhinitis due to pollen: Secondary | ICD-10-CM | POA: Diagnosis not present

## 2015-10-18 DIAGNOSIS — L0231 Cutaneous abscess of buttock: Secondary | ICD-10-CM | POA: Diagnosis not present

## 2015-11-01 DIAGNOSIS — D631 Anemia in chronic kidney disease: Secondary | ICD-10-CM | POA: Diagnosis not present

## 2015-11-01 DIAGNOSIS — I10 Essential (primary) hypertension: Secondary | ICD-10-CM | POA: Diagnosis not present

## 2015-11-01 DIAGNOSIS — D509 Iron deficiency anemia, unspecified: Secondary | ICD-10-CM | POA: Diagnosis not present

## 2015-11-01 DIAGNOSIS — N189 Chronic kidney disease, unspecified: Secondary | ICD-10-CM | POA: Diagnosis not present

## 2015-12-10 DIAGNOSIS — D631 Anemia in chronic kidney disease: Secondary | ICD-10-CM | POA: Diagnosis not present

## 2015-12-10 DIAGNOSIS — N189 Chronic kidney disease, unspecified: Secondary | ICD-10-CM | POA: Diagnosis not present

## 2015-12-31 DIAGNOSIS — D509 Iron deficiency anemia, unspecified: Secondary | ICD-10-CM | POA: Diagnosis not present

## 2016-01-28 DIAGNOSIS — E785 Hyperlipidemia, unspecified: Secondary | ICD-10-CM | POA: Diagnosis not present

## 2016-01-28 DIAGNOSIS — D509 Iron deficiency anemia, unspecified: Secondary | ICD-10-CM | POA: Diagnosis not present

## 2016-01-28 DIAGNOSIS — I10 Essential (primary) hypertension: Secondary | ICD-10-CM | POA: Diagnosis not present

## 2016-01-28 DIAGNOSIS — M109 Gout, unspecified: Secondary | ICD-10-CM | POA: Diagnosis not present

## 2016-01-28 DIAGNOSIS — E1165 Type 2 diabetes mellitus with hyperglycemia: Secondary | ICD-10-CM | POA: Diagnosis not present

## 2016-01-28 DIAGNOSIS — Z79899 Other long term (current) drug therapy: Secondary | ICD-10-CM | POA: Diagnosis not present

## 2016-02-02 DIAGNOSIS — N189 Chronic kidney disease, unspecified: Secondary | ICD-10-CM | POA: Diagnosis not present

## 2016-02-02 DIAGNOSIS — D631 Anemia in chronic kidney disease: Secondary | ICD-10-CM | POA: Diagnosis not present

## 2016-02-02 DIAGNOSIS — D509 Iron deficiency anemia, unspecified: Secondary | ICD-10-CM | POA: Diagnosis not present

## 2016-02-09 DIAGNOSIS — M7989 Other specified soft tissue disorders: Secondary | ICD-10-CM | POA: Diagnosis not present

## 2016-02-09 DIAGNOSIS — M109 Gout, unspecified: Secondary | ICD-10-CM | POA: Diagnosis not present

## 2016-02-09 DIAGNOSIS — M5136 Other intervertebral disc degeneration, lumbar region: Secondary | ICD-10-CM | POA: Diagnosis not present

## 2016-02-09 DIAGNOSIS — D509 Iron deficiency anemia, unspecified: Secondary | ICD-10-CM | POA: Diagnosis not present

## 2016-02-09 DIAGNOSIS — E785 Hyperlipidemia, unspecified: Secondary | ICD-10-CM | POA: Diagnosis not present

## 2016-02-09 DIAGNOSIS — E1165 Type 2 diabetes mellitus with hyperglycemia: Secondary | ICD-10-CM | POA: Diagnosis not present

## 2016-02-09 DIAGNOSIS — I10 Essential (primary) hypertension: Secondary | ICD-10-CM | POA: Diagnosis not present

## 2016-02-23 DIAGNOSIS — I10 Essential (primary) hypertension: Secondary | ICD-10-CM | POA: Diagnosis not present

## 2016-02-23 DIAGNOSIS — Z79899 Other long term (current) drug therapy: Secondary | ICD-10-CM | POA: Diagnosis not present

## 2016-03-07 DIAGNOSIS — I10 Essential (primary) hypertension: Secondary | ICD-10-CM | POA: Diagnosis not present

## 2016-03-07 DIAGNOSIS — M7989 Other specified soft tissue disorders: Secondary | ICD-10-CM | POA: Diagnosis not present

## 2016-03-14 DIAGNOSIS — Z9849 Cataract extraction status, unspecified eye: Secondary | ICD-10-CM | POA: Diagnosis not present

## 2016-03-14 DIAGNOSIS — H40022 Open angle with borderline findings, high risk, left eye: Secondary | ICD-10-CM | POA: Diagnosis not present

## 2016-03-14 DIAGNOSIS — Z961 Presence of intraocular lens: Secondary | ICD-10-CM | POA: Diagnosis not present

## 2016-03-14 DIAGNOSIS — H04123 Dry eye syndrome of bilateral lacrimal glands: Secondary | ICD-10-CM | POA: Diagnosis not present

## 2016-03-14 DIAGNOSIS — H11423 Conjunctival edema, bilateral: Secondary | ICD-10-CM | POA: Diagnosis not present

## 2016-03-14 DIAGNOSIS — H40012 Open angle with borderline findings, low risk, left eye: Secondary | ICD-10-CM | POA: Diagnosis not present

## 2016-03-14 DIAGNOSIS — H11153 Pinguecula, bilateral: Secondary | ICD-10-CM | POA: Diagnosis not present

## 2016-03-14 DIAGNOSIS — H3530 Unspecified macular degeneration: Secondary | ICD-10-CM | POA: Diagnosis not present

## 2016-03-14 DIAGNOSIS — H18413 Arcus senilis, bilateral: Secondary | ICD-10-CM | POA: Diagnosis not present

## 2016-04-03 DIAGNOSIS — D631 Anemia in chronic kidney disease: Secondary | ICD-10-CM | POA: Diagnosis not present

## 2016-04-03 DIAGNOSIS — N189 Chronic kidney disease, unspecified: Secondary | ICD-10-CM | POA: Diagnosis not present

## 2016-04-30 DIAGNOSIS — R000 Tachycardia, unspecified: Secondary | ICD-10-CM | POA: Diagnosis not present

## 2016-04-30 DIAGNOSIS — M109 Gout, unspecified: Secondary | ICD-10-CM

## 2016-04-30 DIAGNOSIS — N179 Acute kidney failure, unspecified: Secondary | ICD-10-CM | POA: Diagnosis not present

## 2016-04-30 DIAGNOSIS — E860 Dehydration: Secondary | ICD-10-CM | POA: Diagnosis not present

## 2016-04-30 DIAGNOSIS — E1140 Type 2 diabetes mellitus with diabetic neuropathy, unspecified: Secondary | ICD-10-CM

## 2016-04-30 DIAGNOSIS — I959 Hypotension, unspecified: Secondary | ICD-10-CM | POA: Diagnosis not present

## 2016-04-30 DIAGNOSIS — R531 Weakness: Secondary | ICD-10-CM | POA: Diagnosis not present

## 2016-04-30 DIAGNOSIS — E119 Type 2 diabetes mellitus without complications: Secondary | ICD-10-CM

## 2016-04-30 DIAGNOSIS — R42 Dizziness and giddiness: Secondary | ICD-10-CM | POA: Diagnosis not present

## 2016-04-30 DIAGNOSIS — I10 Essential (primary) hypertension: Secondary | ICD-10-CM

## 2016-04-30 DIAGNOSIS — E785 Hyperlipidemia, unspecified: Secondary | ICD-10-CM | POA: Diagnosis not present

## 2016-04-30 DIAGNOSIS — R0602 Shortness of breath: Secondary | ICD-10-CM | POA: Diagnosis not present

## 2016-05-01 DIAGNOSIS — I517 Cardiomegaly: Secondary | ICD-10-CM | POA: Diagnosis not present

## 2016-05-01 DIAGNOSIS — I10 Essential (primary) hypertension: Secondary | ICD-10-CM | POA: Diagnosis not present

## 2016-05-01 DIAGNOSIS — E860 Dehydration: Secondary | ICD-10-CM | POA: Diagnosis not present

## 2016-05-01 DIAGNOSIS — I361 Nonrheumatic tricuspid (valve) insufficiency: Secondary | ICD-10-CM | POA: Diagnosis not present

## 2016-05-01 DIAGNOSIS — E119 Type 2 diabetes mellitus without complications: Secondary | ICD-10-CM | POA: Diagnosis not present

## 2016-05-02 DIAGNOSIS — E860 Dehydration: Secondary | ICD-10-CM | POA: Diagnosis not present

## 2016-05-02 DIAGNOSIS — I10 Essential (primary) hypertension: Secondary | ICD-10-CM | POA: Diagnosis not present

## 2016-05-02 DIAGNOSIS — E119 Type 2 diabetes mellitus without complications: Secondary | ICD-10-CM | POA: Diagnosis not present

## 2016-05-03 DIAGNOSIS — E1140 Type 2 diabetes mellitus with diabetic neuropathy, unspecified: Secondary | ICD-10-CM | POA: Diagnosis not present

## 2016-05-03 DIAGNOSIS — Z9181 History of falling: Secondary | ICD-10-CM | POA: Diagnosis not present

## 2016-05-03 DIAGNOSIS — N189 Chronic kidney disease, unspecified: Secondary | ICD-10-CM | POA: Diagnosis not present

## 2016-05-03 DIAGNOSIS — Z7984 Long term (current) use of oral hypoglycemic drugs: Secondary | ICD-10-CM | POA: Diagnosis not present

## 2016-05-03 DIAGNOSIS — I129 Hypertensive chronic kidney disease with stage 1 through stage 4 chronic kidney disease, or unspecified chronic kidney disease: Secondary | ICD-10-CM | POA: Diagnosis not present

## 2016-05-03 DIAGNOSIS — E1122 Type 2 diabetes mellitus with diabetic chronic kidney disease: Secondary | ICD-10-CM | POA: Diagnosis not present

## 2016-05-08 DIAGNOSIS — Z7984 Long term (current) use of oral hypoglycemic drugs: Secondary | ICD-10-CM | POA: Diagnosis not present

## 2016-05-08 DIAGNOSIS — I129 Hypertensive chronic kidney disease with stage 1 through stage 4 chronic kidney disease, or unspecified chronic kidney disease: Secondary | ICD-10-CM | POA: Diagnosis not present

## 2016-05-08 DIAGNOSIS — E1122 Type 2 diabetes mellitus with diabetic chronic kidney disease: Secondary | ICD-10-CM | POA: Diagnosis not present

## 2016-05-08 DIAGNOSIS — E1140 Type 2 diabetes mellitus with diabetic neuropathy, unspecified: Secondary | ICD-10-CM | POA: Diagnosis not present

## 2016-05-08 DIAGNOSIS — Z9181 History of falling: Secondary | ICD-10-CM | POA: Diagnosis not present

## 2016-05-08 DIAGNOSIS — N189 Chronic kidney disease, unspecified: Secondary | ICD-10-CM | POA: Diagnosis not present

## 2016-05-09 DIAGNOSIS — E1165 Type 2 diabetes mellitus with hyperglycemia: Secondary | ICD-10-CM | POA: Diagnosis not present

## 2016-05-09 DIAGNOSIS — I10 Essential (primary) hypertension: Secondary | ICD-10-CM | POA: Diagnosis not present

## 2016-05-09 DIAGNOSIS — N390 Urinary tract infection, site not specified: Secondary | ICD-10-CM | POA: Diagnosis not present

## 2016-05-10 DIAGNOSIS — E1122 Type 2 diabetes mellitus with diabetic chronic kidney disease: Secondary | ICD-10-CM | POA: Diagnosis not present

## 2016-05-10 DIAGNOSIS — Z7984 Long term (current) use of oral hypoglycemic drugs: Secondary | ICD-10-CM | POA: Diagnosis not present

## 2016-05-10 DIAGNOSIS — Z9181 History of falling: Secondary | ICD-10-CM | POA: Diagnosis not present

## 2016-05-10 DIAGNOSIS — E1140 Type 2 diabetes mellitus with diabetic neuropathy, unspecified: Secondary | ICD-10-CM | POA: Diagnosis not present

## 2016-05-10 DIAGNOSIS — N189 Chronic kidney disease, unspecified: Secondary | ICD-10-CM | POA: Diagnosis not present

## 2016-05-10 DIAGNOSIS — I129 Hypertensive chronic kidney disease with stage 1 through stage 4 chronic kidney disease, or unspecified chronic kidney disease: Secondary | ICD-10-CM | POA: Diagnosis not present

## 2016-05-10 DIAGNOSIS — N390 Urinary tract infection, site not specified: Secondary | ICD-10-CM | POA: Diagnosis not present

## 2016-05-12 DIAGNOSIS — E1122 Type 2 diabetes mellitus with diabetic chronic kidney disease: Secondary | ICD-10-CM | POA: Diagnosis not present

## 2016-05-12 DIAGNOSIS — Z7984 Long term (current) use of oral hypoglycemic drugs: Secondary | ICD-10-CM | POA: Diagnosis not present

## 2016-05-12 DIAGNOSIS — Z9181 History of falling: Secondary | ICD-10-CM | POA: Diagnosis not present

## 2016-05-12 DIAGNOSIS — N189 Chronic kidney disease, unspecified: Secondary | ICD-10-CM | POA: Diagnosis not present

## 2016-05-12 DIAGNOSIS — I129 Hypertensive chronic kidney disease with stage 1 through stage 4 chronic kidney disease, or unspecified chronic kidney disease: Secondary | ICD-10-CM | POA: Diagnosis not present

## 2016-05-12 DIAGNOSIS — E1140 Type 2 diabetes mellitus with diabetic neuropathy, unspecified: Secondary | ICD-10-CM | POA: Diagnosis not present

## 2016-05-13 DIAGNOSIS — N390 Urinary tract infection, site not specified: Secondary | ICD-10-CM | POA: Diagnosis not present

## 2016-05-13 DIAGNOSIS — R739 Hyperglycemia, unspecified: Secondary | ICD-10-CM | POA: Diagnosis not present

## 2016-05-13 DIAGNOSIS — I5032 Chronic diastolic (congestive) heart failure: Secondary | ICD-10-CM | POA: Diagnosis not present

## 2016-05-13 DIAGNOSIS — R918 Other nonspecific abnormal finding of lung field: Secondary | ICD-10-CM | POA: Diagnosis not present

## 2016-05-13 DIAGNOSIS — I110 Hypertensive heart disease with heart failure: Secondary | ICD-10-CM | POA: Diagnosis not present

## 2016-05-13 DIAGNOSIS — G9341 Metabolic encephalopathy: Secondary | ICD-10-CM | POA: Diagnosis not present

## 2016-05-13 DIAGNOSIS — I160 Hypertensive urgency: Secondary | ICD-10-CM | POA: Diagnosis not present

## 2016-05-13 DIAGNOSIS — I509 Heart failure, unspecified: Secondary | ICD-10-CM | POA: Diagnosis not present

## 2016-05-13 DIAGNOSIS — J189 Pneumonia, unspecified organism: Secondary | ICD-10-CM | POA: Diagnosis not present

## 2016-05-13 DIAGNOSIS — N183 Chronic kidney disease, stage 3 (moderate): Secondary | ICD-10-CM | POA: Diagnosis not present

## 2016-05-13 DIAGNOSIS — E1122 Type 2 diabetes mellitus with diabetic chronic kidney disease: Secondary | ICD-10-CM | POA: Diagnosis not present

## 2016-05-13 DIAGNOSIS — N3001 Acute cystitis with hematuria: Secondary | ICD-10-CM | POA: Diagnosis not present

## 2016-05-13 DIAGNOSIS — K5730 Diverticulosis of large intestine without perforation or abscess without bleeding: Secondary | ICD-10-CM | POA: Diagnosis not present

## 2016-05-13 DIAGNOSIS — R488 Other symbolic dysfunctions: Secondary | ICD-10-CM | POA: Diagnosis not present

## 2016-05-13 DIAGNOSIS — G9340 Encephalopathy, unspecified: Secondary | ICD-10-CM | POA: Diagnosis not present

## 2016-05-13 DIAGNOSIS — I1310 Hypertensive heart and chronic kidney disease without heart failure, with stage 1 through stage 4 chronic kidney disease, or unspecified chronic kidney disease: Secondary | ICD-10-CM | POA: Diagnosis not present

## 2016-05-13 DIAGNOSIS — E119 Type 2 diabetes mellitus without complications: Secondary | ICD-10-CM | POA: Diagnosis not present

## 2016-05-13 DIAGNOSIS — E1165 Type 2 diabetes mellitus with hyperglycemia: Secondary | ICD-10-CM | POA: Diagnosis not present

## 2016-05-14 DIAGNOSIS — N390 Urinary tract infection, site not specified: Secondary | ICD-10-CM

## 2016-05-16 DIAGNOSIS — N390 Urinary tract infection, site not specified: Secondary | ICD-10-CM | POA: Diagnosis not present

## 2016-05-16 DIAGNOSIS — E782 Mixed hyperlipidemia: Secondary | ICD-10-CM | POA: Diagnosis not present

## 2016-05-16 DIAGNOSIS — N183 Chronic kidney disease, stage 3 (moderate): Secondary | ICD-10-CM | POA: Diagnosis not present

## 2016-05-16 DIAGNOSIS — I499 Cardiac arrhythmia, unspecified: Secondary | ICD-10-CM | POA: Diagnosis not present

## 2016-05-16 DIAGNOSIS — R000 Tachycardia, unspecified: Secondary | ICD-10-CM | POA: Diagnosis not present

## 2016-05-16 DIAGNOSIS — E119 Type 2 diabetes mellitus without complications: Secondary | ICD-10-CM | POA: Diagnosis not present

## 2016-05-16 DIAGNOSIS — D518 Other vitamin B12 deficiency anemias: Secondary | ICD-10-CM | POA: Diagnosis not present

## 2016-05-16 DIAGNOSIS — G9340 Encephalopathy, unspecified: Secondary | ICD-10-CM | POA: Diagnosis not present

## 2016-05-16 DIAGNOSIS — Z9181 History of falling: Secondary | ICD-10-CM | POA: Diagnosis not present

## 2016-05-16 DIAGNOSIS — R488 Other symbolic dysfunctions: Secondary | ICD-10-CM | POA: Diagnosis not present

## 2016-05-16 DIAGNOSIS — R9431 Abnormal electrocardiogram [ECG] [EKG]: Secondary | ICD-10-CM | POA: Diagnosis not present

## 2016-05-16 DIAGNOSIS — I129 Hypertensive chronic kidney disease with stage 1 through stage 4 chronic kidney disease, or unspecified chronic kidney disease: Secondary | ICD-10-CM | POA: Diagnosis not present

## 2016-05-16 DIAGNOSIS — I160 Hypertensive urgency: Secondary | ICD-10-CM | POA: Diagnosis not present

## 2016-05-16 DIAGNOSIS — Z7984 Long term (current) use of oral hypoglycemic drugs: Secondary | ICD-10-CM | POA: Diagnosis not present

## 2016-05-16 DIAGNOSIS — E1122 Type 2 diabetes mellitus with diabetic chronic kidney disease: Secondary | ICD-10-CM | POA: Diagnosis not present

## 2016-05-16 DIAGNOSIS — N189 Chronic kidney disease, unspecified: Secondary | ICD-10-CM | POA: Diagnosis not present

## 2016-05-16 DIAGNOSIS — J189 Pneumonia, unspecified organism: Secondary | ICD-10-CM | POA: Diagnosis not present

## 2016-05-16 DIAGNOSIS — E1140 Type 2 diabetes mellitus with diabetic neuropathy, unspecified: Secondary | ICD-10-CM | POA: Diagnosis not present

## 2016-05-16 DIAGNOSIS — Z79899 Other long term (current) drug therapy: Secondary | ICD-10-CM | POA: Diagnosis not present

## 2016-05-16 DIAGNOSIS — K5730 Diverticulosis of large intestine without perforation or abscess without bleeding: Secondary | ICD-10-CM | POA: Diagnosis not present

## 2016-05-16 DIAGNOSIS — I10 Essential (primary) hypertension: Secondary | ICD-10-CM | POA: Diagnosis not present

## 2016-05-16 DIAGNOSIS — R531 Weakness: Secondary | ICD-10-CM | POA: Diagnosis not present

## 2016-05-16 DIAGNOSIS — R739 Hyperglycemia, unspecified: Secondary | ICD-10-CM | POA: Diagnosis not present

## 2016-05-17 DIAGNOSIS — J189 Pneumonia, unspecified organism: Secondary | ICD-10-CM | POA: Diagnosis not present

## 2016-05-17 DIAGNOSIS — R000 Tachycardia, unspecified: Secondary | ICD-10-CM | POA: Diagnosis not present

## 2016-05-17 DIAGNOSIS — I10 Essential (primary) hypertension: Secondary | ICD-10-CM | POA: Diagnosis not present

## 2016-05-17 DIAGNOSIS — E119 Type 2 diabetes mellitus without complications: Secondary | ICD-10-CM | POA: Diagnosis not present

## 2016-05-24 DIAGNOSIS — J189 Pneumonia, unspecified organism: Secondary | ICD-10-CM | POA: Diagnosis not present

## 2016-05-24 DIAGNOSIS — R531 Weakness: Secondary | ICD-10-CM | POA: Diagnosis not present

## 2016-05-24 DIAGNOSIS — I10 Essential (primary) hypertension: Secondary | ICD-10-CM | POA: Diagnosis not present

## 2016-05-24 DIAGNOSIS — E119 Type 2 diabetes mellitus without complications: Secondary | ICD-10-CM | POA: Diagnosis not present

## 2016-05-26 DIAGNOSIS — R531 Weakness: Secondary | ICD-10-CM | POA: Diagnosis not present

## 2016-05-26 DIAGNOSIS — I10 Essential (primary) hypertension: Secondary | ICD-10-CM | POA: Diagnosis not present

## 2016-05-26 DIAGNOSIS — R000 Tachycardia, unspecified: Secondary | ICD-10-CM | POA: Diagnosis not present

## 2016-05-26 DIAGNOSIS — E119 Type 2 diabetes mellitus without complications: Secondary | ICD-10-CM | POA: Diagnosis not present

## 2016-05-30 DIAGNOSIS — E1140 Type 2 diabetes mellitus with diabetic neuropathy, unspecified: Secondary | ICD-10-CM | POA: Diagnosis not present

## 2016-05-30 DIAGNOSIS — Z7984 Long term (current) use of oral hypoglycemic drugs: Secondary | ICD-10-CM | POA: Diagnosis not present

## 2016-05-30 DIAGNOSIS — I129 Hypertensive chronic kidney disease with stage 1 through stage 4 chronic kidney disease, or unspecified chronic kidney disease: Secondary | ICD-10-CM | POA: Diagnosis not present

## 2016-05-30 DIAGNOSIS — E1122 Type 2 diabetes mellitus with diabetic chronic kidney disease: Secondary | ICD-10-CM | POA: Diagnosis not present

## 2016-05-30 DIAGNOSIS — Z9181 History of falling: Secondary | ICD-10-CM | POA: Diagnosis not present

## 2016-05-30 DIAGNOSIS — N189 Chronic kidney disease, unspecified: Secondary | ICD-10-CM | POA: Diagnosis not present

## 2016-06-05 DIAGNOSIS — D631 Anemia in chronic kidney disease: Secondary | ICD-10-CM | POA: Diagnosis not present

## 2016-06-05 DIAGNOSIS — I5032 Chronic diastolic (congestive) heart failure: Secondary | ICD-10-CM | POA: Diagnosis not present

## 2016-06-05 DIAGNOSIS — E1122 Type 2 diabetes mellitus with diabetic chronic kidney disease: Secondary | ICD-10-CM | POA: Diagnosis not present

## 2016-06-05 DIAGNOSIS — D509 Iron deficiency anemia, unspecified: Secondary | ICD-10-CM | POA: Diagnosis not present

## 2016-06-05 DIAGNOSIS — N183 Chronic kidney disease, stage 3 (moderate): Secondary | ICD-10-CM | POA: Diagnosis not present

## 2016-06-05 DIAGNOSIS — I130 Hypertensive heart and chronic kidney disease with heart failure and stage 1 through stage 4 chronic kidney disease, or unspecified chronic kidney disease: Secondary | ICD-10-CM | POA: Diagnosis not present

## 2016-06-05 DIAGNOSIS — J189 Pneumonia, unspecified organism: Secondary | ICD-10-CM | POA: Diagnosis not present

## 2016-06-05 DIAGNOSIS — N390 Urinary tract infection, site not specified: Secondary | ICD-10-CM | POA: Diagnosis not present

## 2016-06-06 DIAGNOSIS — R000 Tachycardia, unspecified: Secondary | ICD-10-CM | POA: Diagnosis not present

## 2016-06-06 DIAGNOSIS — I130 Hypertensive heart and chronic kidney disease with heart failure and stage 1 through stage 4 chronic kidney disease, or unspecified chronic kidney disease: Secondary | ICD-10-CM | POA: Diagnosis not present

## 2016-06-06 DIAGNOSIS — N390 Urinary tract infection, site not specified: Secondary | ICD-10-CM | POA: Diagnosis not present

## 2016-06-06 DIAGNOSIS — I10 Essential (primary) hypertension: Secondary | ICD-10-CM | POA: Diagnosis not present

## 2016-06-06 DIAGNOSIS — E1122 Type 2 diabetes mellitus with diabetic chronic kidney disease: Secondary | ICD-10-CM | POA: Diagnosis not present

## 2016-06-06 DIAGNOSIS — M25473 Effusion, unspecified ankle: Secondary | ICD-10-CM | POA: Diagnosis not present

## 2016-06-06 DIAGNOSIS — Z79899 Other long term (current) drug therapy: Secondary | ICD-10-CM | POA: Diagnosis not present

## 2016-06-06 DIAGNOSIS — E1165 Type 2 diabetes mellitus with hyperglycemia: Secondary | ICD-10-CM | POA: Diagnosis not present

## 2016-06-06 DIAGNOSIS — J189 Pneumonia, unspecified organism: Secondary | ICD-10-CM | POA: Diagnosis not present

## 2016-06-06 DIAGNOSIS — N183 Chronic kidney disease, stage 3 (moderate): Secondary | ICD-10-CM | POA: Diagnosis not present

## 2016-06-09 DIAGNOSIS — N390 Urinary tract infection, site not specified: Secondary | ICD-10-CM | POA: Diagnosis not present

## 2016-06-09 DIAGNOSIS — I130 Hypertensive heart and chronic kidney disease with heart failure and stage 1 through stage 4 chronic kidney disease, or unspecified chronic kidney disease: Secondary | ICD-10-CM | POA: Diagnosis not present

## 2016-06-09 DIAGNOSIS — E1122 Type 2 diabetes mellitus with diabetic chronic kidney disease: Secondary | ICD-10-CM | POA: Diagnosis not present

## 2016-06-09 DIAGNOSIS — N183 Chronic kidney disease, stage 3 (moderate): Secondary | ICD-10-CM | POA: Diagnosis not present

## 2016-06-09 DIAGNOSIS — J189 Pneumonia, unspecified organism: Secondary | ICD-10-CM | POA: Diagnosis not present

## 2016-06-13 DIAGNOSIS — N390 Urinary tract infection, site not specified: Secondary | ICD-10-CM | POA: Diagnosis not present

## 2016-06-13 DIAGNOSIS — E1122 Type 2 diabetes mellitus with diabetic chronic kidney disease: Secondary | ICD-10-CM | POA: Diagnosis not present

## 2016-06-13 DIAGNOSIS — I130 Hypertensive heart and chronic kidney disease with heart failure and stage 1 through stage 4 chronic kidney disease, or unspecified chronic kidney disease: Secondary | ICD-10-CM | POA: Diagnosis not present

## 2016-06-13 DIAGNOSIS — N183 Chronic kidney disease, stage 3 (moderate): Secondary | ICD-10-CM | POA: Diagnosis not present

## 2016-06-13 DIAGNOSIS — J189 Pneumonia, unspecified organism: Secondary | ICD-10-CM | POA: Diagnosis not present

## 2016-06-14 DIAGNOSIS — J189 Pneumonia, unspecified organism: Secondary | ICD-10-CM | POA: Diagnosis not present

## 2016-06-14 DIAGNOSIS — I130 Hypertensive heart and chronic kidney disease with heart failure and stage 1 through stage 4 chronic kidney disease, or unspecified chronic kidney disease: Secondary | ICD-10-CM | POA: Diagnosis not present

## 2016-06-14 DIAGNOSIS — N183 Chronic kidney disease, stage 3 (moderate): Secondary | ICD-10-CM | POA: Diagnosis not present

## 2016-06-14 DIAGNOSIS — N390 Urinary tract infection, site not specified: Secondary | ICD-10-CM | POA: Diagnosis not present

## 2016-06-14 DIAGNOSIS — E1122 Type 2 diabetes mellitus with diabetic chronic kidney disease: Secondary | ICD-10-CM | POA: Diagnosis not present

## 2016-06-15 DIAGNOSIS — N183 Chronic kidney disease, stage 3 (moderate): Secondary | ICD-10-CM | POA: Diagnosis not present

## 2016-06-15 DIAGNOSIS — N390 Urinary tract infection, site not specified: Secondary | ICD-10-CM | POA: Diagnosis not present

## 2016-06-15 DIAGNOSIS — I130 Hypertensive heart and chronic kidney disease with heart failure and stage 1 through stage 4 chronic kidney disease, or unspecified chronic kidney disease: Secondary | ICD-10-CM | POA: Diagnosis not present

## 2016-06-15 DIAGNOSIS — J189 Pneumonia, unspecified organism: Secondary | ICD-10-CM | POA: Diagnosis not present

## 2016-06-15 DIAGNOSIS — E1122 Type 2 diabetes mellitus with diabetic chronic kidney disease: Secondary | ICD-10-CM | POA: Diagnosis not present

## 2016-06-16 DIAGNOSIS — E1122 Type 2 diabetes mellitus with diabetic chronic kidney disease: Secondary | ICD-10-CM | POA: Diagnosis not present

## 2016-06-16 DIAGNOSIS — N183 Chronic kidney disease, stage 3 (moderate): Secondary | ICD-10-CM | POA: Diagnosis not present

## 2016-06-16 DIAGNOSIS — J189 Pneumonia, unspecified organism: Secondary | ICD-10-CM | POA: Diagnosis not present

## 2016-06-16 DIAGNOSIS — N390 Urinary tract infection, site not specified: Secondary | ICD-10-CM | POA: Diagnosis not present

## 2016-06-16 DIAGNOSIS — I130 Hypertensive heart and chronic kidney disease with heart failure and stage 1 through stage 4 chronic kidney disease, or unspecified chronic kidney disease: Secondary | ICD-10-CM | POA: Diagnosis not present

## 2016-06-20 DIAGNOSIS — R000 Tachycardia, unspecified: Secondary | ICD-10-CM | POA: Diagnosis not present

## 2016-06-20 DIAGNOSIS — E1165 Type 2 diabetes mellitus with hyperglycemia: Secondary | ICD-10-CM | POA: Diagnosis not present

## 2016-06-20 DIAGNOSIS — I10 Essential (primary) hypertension: Secondary | ICD-10-CM | POA: Diagnosis not present

## 2016-06-20 DIAGNOSIS — Z79899 Other long term (current) drug therapy: Secondary | ICD-10-CM | POA: Diagnosis not present

## 2016-06-20 DIAGNOSIS — D509 Iron deficiency anemia, unspecified: Secondary | ICD-10-CM | POA: Diagnosis not present

## 2016-06-20 DIAGNOSIS — M109 Gout, unspecified: Secondary | ICD-10-CM | POA: Diagnosis not present

## 2016-06-20 DIAGNOSIS — E785 Hyperlipidemia, unspecified: Secondary | ICD-10-CM | POA: Diagnosis not present

## 2016-06-21 DIAGNOSIS — D631 Anemia in chronic kidney disease: Secondary | ICD-10-CM | POA: Diagnosis not present

## 2016-06-21 DIAGNOSIS — I5032 Chronic diastolic (congestive) heart failure: Secondary | ICD-10-CM | POA: Diagnosis not present

## 2016-06-21 DIAGNOSIS — N183 Chronic kidney disease, stage 3 (moderate): Secondary | ICD-10-CM | POA: Diagnosis not present

## 2016-06-21 DIAGNOSIS — E1122 Type 2 diabetes mellitus with diabetic chronic kidney disease: Secondary | ICD-10-CM | POA: Diagnosis not present

## 2016-06-21 DIAGNOSIS — I130 Hypertensive heart and chronic kidney disease with heart failure and stage 1 through stage 4 chronic kidney disease, or unspecified chronic kidney disease: Secondary | ICD-10-CM | POA: Diagnosis not present

## 2016-06-21 DIAGNOSIS — J189 Pneumonia, unspecified organism: Secondary | ICD-10-CM | POA: Diagnosis not present

## 2016-06-21 DIAGNOSIS — D509 Iron deficiency anemia, unspecified: Secondary | ICD-10-CM | POA: Diagnosis not present

## 2016-06-23 DIAGNOSIS — D631 Anemia in chronic kidney disease: Secondary | ICD-10-CM | POA: Diagnosis not present

## 2016-06-23 DIAGNOSIS — N183 Chronic kidney disease, stage 3 (moderate): Secondary | ICD-10-CM | POA: Diagnosis not present

## 2016-06-23 DIAGNOSIS — J189 Pneumonia, unspecified organism: Secondary | ICD-10-CM | POA: Diagnosis not present

## 2016-06-23 DIAGNOSIS — E1122 Type 2 diabetes mellitus with diabetic chronic kidney disease: Secondary | ICD-10-CM | POA: Diagnosis not present

## 2016-06-23 DIAGNOSIS — I130 Hypertensive heart and chronic kidney disease with heart failure and stage 1 through stage 4 chronic kidney disease, or unspecified chronic kidney disease: Secondary | ICD-10-CM | POA: Diagnosis not present

## 2016-06-23 DIAGNOSIS — I5032 Chronic diastolic (congestive) heart failure: Secondary | ICD-10-CM | POA: Diagnosis not present

## 2016-06-26 DIAGNOSIS — N183 Chronic kidney disease, stage 3 (moderate): Secondary | ICD-10-CM | POA: Diagnosis not present

## 2016-06-26 DIAGNOSIS — D631 Anemia in chronic kidney disease: Secondary | ICD-10-CM | POA: Diagnosis not present

## 2016-06-26 DIAGNOSIS — E1122 Type 2 diabetes mellitus with diabetic chronic kidney disease: Secondary | ICD-10-CM | POA: Diagnosis not present

## 2016-06-26 DIAGNOSIS — I130 Hypertensive heart and chronic kidney disease with heart failure and stage 1 through stage 4 chronic kidney disease, or unspecified chronic kidney disease: Secondary | ICD-10-CM | POA: Diagnosis not present

## 2016-06-26 DIAGNOSIS — J189 Pneumonia, unspecified organism: Secondary | ICD-10-CM | POA: Diagnosis not present

## 2016-06-26 DIAGNOSIS — I5032 Chronic diastolic (congestive) heart failure: Secondary | ICD-10-CM | POA: Diagnosis not present

## 2016-06-27 DIAGNOSIS — D631 Anemia in chronic kidney disease: Secondary | ICD-10-CM | POA: Diagnosis not present

## 2016-06-27 DIAGNOSIS — I5032 Chronic diastolic (congestive) heart failure: Secondary | ICD-10-CM | POA: Diagnosis not present

## 2016-06-27 DIAGNOSIS — N183 Chronic kidney disease, stage 3 (moderate): Secondary | ICD-10-CM | POA: Diagnosis not present

## 2016-06-27 DIAGNOSIS — I130 Hypertensive heart and chronic kidney disease with heart failure and stage 1 through stage 4 chronic kidney disease, or unspecified chronic kidney disease: Secondary | ICD-10-CM | POA: Diagnosis not present

## 2016-06-27 DIAGNOSIS — J189 Pneumonia, unspecified organism: Secondary | ICD-10-CM | POA: Diagnosis not present

## 2016-06-27 DIAGNOSIS — E1122 Type 2 diabetes mellitus with diabetic chronic kidney disease: Secondary | ICD-10-CM | POA: Diagnosis not present

## 2016-06-28 DIAGNOSIS — D631 Anemia in chronic kidney disease: Secondary | ICD-10-CM | POA: Diagnosis not present

## 2016-06-28 DIAGNOSIS — J189 Pneumonia, unspecified organism: Secondary | ICD-10-CM | POA: Diagnosis not present

## 2016-06-28 DIAGNOSIS — E1122 Type 2 diabetes mellitus with diabetic chronic kidney disease: Secondary | ICD-10-CM | POA: Diagnosis not present

## 2016-06-28 DIAGNOSIS — N183 Chronic kidney disease, stage 3 (moderate): Secondary | ICD-10-CM | POA: Diagnosis not present

## 2016-06-28 DIAGNOSIS — I5032 Chronic diastolic (congestive) heart failure: Secondary | ICD-10-CM | POA: Diagnosis not present

## 2016-06-28 DIAGNOSIS — E875 Hyperkalemia: Secondary | ICD-10-CM | POA: Diagnosis not present

## 2016-06-28 DIAGNOSIS — I130 Hypertensive heart and chronic kidney disease with heart failure and stage 1 through stage 4 chronic kidney disease, or unspecified chronic kidney disease: Secondary | ICD-10-CM | POA: Diagnosis not present

## 2016-06-29 DIAGNOSIS — N189 Chronic kidney disease, unspecified: Secondary | ICD-10-CM | POA: Diagnosis not present

## 2016-06-29 DIAGNOSIS — E875 Hyperkalemia: Secondary | ICD-10-CM | POA: Diagnosis not present

## 2016-06-29 DIAGNOSIS — I129 Hypertensive chronic kidney disease with stage 1 through stage 4 chronic kidney disease, or unspecified chronic kidney disease: Secondary | ICD-10-CM | POA: Diagnosis not present

## 2016-06-29 DIAGNOSIS — E872 Acidosis: Secondary | ICD-10-CM | POA: Diagnosis not present

## 2016-07-01 DIAGNOSIS — N189 Chronic kidney disease, unspecified: Secondary | ICD-10-CM | POA: Diagnosis not present

## 2016-07-01 DIAGNOSIS — Z7984 Long term (current) use of oral hypoglycemic drugs: Secondary | ICD-10-CM | POA: Diagnosis not present

## 2016-07-01 DIAGNOSIS — I129 Hypertensive chronic kidney disease with stage 1 through stage 4 chronic kidney disease, or unspecified chronic kidney disease: Secondary | ICD-10-CM | POA: Diagnosis not present

## 2016-07-01 DIAGNOSIS — Z9181 History of falling: Secondary | ICD-10-CM | POA: Diagnosis not present

## 2016-07-01 DIAGNOSIS — E1140 Type 2 diabetes mellitus with diabetic neuropathy, unspecified: Secondary | ICD-10-CM | POA: Diagnosis not present

## 2016-07-01 DIAGNOSIS — E1122 Type 2 diabetes mellitus with diabetic chronic kidney disease: Secondary | ICD-10-CM | POA: Diagnosis not present

## 2016-07-01 DIAGNOSIS — I10 Essential (primary) hypertension: Secondary | ICD-10-CM | POA: Diagnosis not present

## 2016-07-01 DIAGNOSIS — E875 Hyperkalemia: Secondary | ICD-10-CM | POA: Diagnosis not present

## 2016-07-03 DIAGNOSIS — I10 Essential (primary) hypertension: Secondary | ICD-10-CM | POA: Diagnosis not present

## 2016-07-03 DIAGNOSIS — E875 Hyperkalemia: Secondary | ICD-10-CM | POA: Diagnosis not present

## 2016-07-04 DIAGNOSIS — E1122 Type 2 diabetes mellitus with diabetic chronic kidney disease: Secondary | ICD-10-CM | POA: Diagnosis not present

## 2016-07-04 DIAGNOSIS — I130 Hypertensive heart and chronic kidney disease with heart failure and stage 1 through stage 4 chronic kidney disease, or unspecified chronic kidney disease: Secondary | ICD-10-CM | POA: Diagnosis not present

## 2016-07-04 DIAGNOSIS — I5032 Chronic diastolic (congestive) heart failure: Secondary | ICD-10-CM | POA: Diagnosis not present

## 2016-07-04 DIAGNOSIS — D631 Anemia in chronic kidney disease: Secondary | ICD-10-CM | POA: Diagnosis not present

## 2016-07-04 DIAGNOSIS — J189 Pneumonia, unspecified organism: Secondary | ICD-10-CM | POA: Diagnosis not present

## 2016-07-04 DIAGNOSIS — N183 Chronic kidney disease, stage 3 (moderate): Secondary | ICD-10-CM | POA: Diagnosis not present

## 2016-07-08 DIAGNOSIS — E1122 Type 2 diabetes mellitus with diabetic chronic kidney disease: Secondary | ICD-10-CM | POA: Diagnosis not present

## 2016-07-08 DIAGNOSIS — N183 Chronic kidney disease, stage 3 (moderate): Secondary | ICD-10-CM | POA: Diagnosis not present

## 2016-07-08 DIAGNOSIS — I5032 Chronic diastolic (congestive) heart failure: Secondary | ICD-10-CM | POA: Diagnosis not present

## 2016-07-08 DIAGNOSIS — D631 Anemia in chronic kidney disease: Secondary | ICD-10-CM | POA: Diagnosis not present

## 2016-07-08 DIAGNOSIS — J189 Pneumonia, unspecified organism: Secondary | ICD-10-CM | POA: Diagnosis not present

## 2016-07-08 DIAGNOSIS — I130 Hypertensive heart and chronic kidney disease with heart failure and stage 1 through stage 4 chronic kidney disease, or unspecified chronic kidney disease: Secondary | ICD-10-CM | POA: Diagnosis not present

## 2016-07-11 DIAGNOSIS — N183 Chronic kidney disease, stage 3 (moderate): Secondary | ICD-10-CM | POA: Diagnosis not present

## 2016-07-11 DIAGNOSIS — D631 Anemia in chronic kidney disease: Secondary | ICD-10-CM | POA: Diagnosis not present

## 2016-07-11 DIAGNOSIS — J189 Pneumonia, unspecified organism: Secondary | ICD-10-CM | POA: Diagnosis not present

## 2016-07-11 DIAGNOSIS — I5032 Chronic diastolic (congestive) heart failure: Secondary | ICD-10-CM | POA: Diagnosis not present

## 2016-07-11 DIAGNOSIS — E1122 Type 2 diabetes mellitus with diabetic chronic kidney disease: Secondary | ICD-10-CM | POA: Diagnosis not present

## 2016-07-11 DIAGNOSIS — I130 Hypertensive heart and chronic kidney disease with heart failure and stage 1 through stage 4 chronic kidney disease, or unspecified chronic kidney disease: Secondary | ICD-10-CM | POA: Diagnosis not present

## 2016-07-14 DIAGNOSIS — D631 Anemia in chronic kidney disease: Secondary | ICD-10-CM | POA: Diagnosis not present

## 2016-07-14 DIAGNOSIS — N183 Chronic kidney disease, stage 3 (moderate): Secondary | ICD-10-CM | POA: Diagnosis not present

## 2016-07-14 DIAGNOSIS — J189 Pneumonia, unspecified organism: Secondary | ICD-10-CM | POA: Diagnosis not present

## 2016-07-14 DIAGNOSIS — E1122 Type 2 diabetes mellitus with diabetic chronic kidney disease: Secondary | ICD-10-CM | POA: Diagnosis not present

## 2016-07-14 DIAGNOSIS — I5032 Chronic diastolic (congestive) heart failure: Secondary | ICD-10-CM | POA: Diagnosis not present

## 2016-07-14 DIAGNOSIS — I130 Hypertensive heart and chronic kidney disease with heart failure and stage 1 through stage 4 chronic kidney disease, or unspecified chronic kidney disease: Secondary | ICD-10-CM | POA: Diagnosis not present

## 2016-07-17 DIAGNOSIS — D631 Anemia in chronic kidney disease: Secondary | ICD-10-CM | POA: Diagnosis not present

## 2016-07-17 DIAGNOSIS — N183 Chronic kidney disease, stage 3 (moderate): Secondary | ICD-10-CM | POA: Diagnosis not present

## 2016-07-17 DIAGNOSIS — J189 Pneumonia, unspecified organism: Secondary | ICD-10-CM | POA: Diagnosis not present

## 2016-07-17 DIAGNOSIS — E1122 Type 2 diabetes mellitus with diabetic chronic kidney disease: Secondary | ICD-10-CM | POA: Diagnosis not present

## 2016-07-17 DIAGNOSIS — I5032 Chronic diastolic (congestive) heart failure: Secondary | ICD-10-CM | POA: Diagnosis not present

## 2016-07-17 DIAGNOSIS — I130 Hypertensive heart and chronic kidney disease with heart failure and stage 1 through stage 4 chronic kidney disease, or unspecified chronic kidney disease: Secondary | ICD-10-CM | POA: Diagnosis not present

## 2016-07-18 DIAGNOSIS — I10 Essential (primary) hypertension: Secondary | ICD-10-CM | POA: Diagnosis not present

## 2016-07-18 DIAGNOSIS — Z79899 Other long term (current) drug therapy: Secondary | ICD-10-CM | POA: Diagnosis not present

## 2016-07-18 DIAGNOSIS — R42 Dizziness and giddiness: Secondary | ICD-10-CM | POA: Diagnosis not present

## 2016-07-19 DIAGNOSIS — J189 Pneumonia, unspecified organism: Secondary | ICD-10-CM | POA: Diagnosis not present

## 2016-07-19 DIAGNOSIS — I130 Hypertensive heart and chronic kidney disease with heart failure and stage 1 through stage 4 chronic kidney disease, or unspecified chronic kidney disease: Secondary | ICD-10-CM | POA: Diagnosis not present

## 2016-07-19 DIAGNOSIS — I5032 Chronic diastolic (congestive) heart failure: Secondary | ICD-10-CM | POA: Diagnosis not present

## 2016-07-19 DIAGNOSIS — D631 Anemia in chronic kidney disease: Secondary | ICD-10-CM | POA: Diagnosis not present

## 2016-07-19 DIAGNOSIS — N183 Chronic kidney disease, stage 3 (moderate): Secondary | ICD-10-CM | POA: Diagnosis not present

## 2016-07-19 DIAGNOSIS — E1122 Type 2 diabetes mellitus with diabetic chronic kidney disease: Secondary | ICD-10-CM | POA: Diagnosis not present

## 2016-07-23 DIAGNOSIS — N189 Chronic kidney disease, unspecified: Secondary | ICD-10-CM | POA: Diagnosis not present

## 2016-07-23 DIAGNOSIS — E1122 Type 2 diabetes mellitus with diabetic chronic kidney disease: Secondary | ICD-10-CM | POA: Diagnosis not present

## 2016-07-23 DIAGNOSIS — E1140 Type 2 diabetes mellitus with diabetic neuropathy, unspecified: Secondary | ICD-10-CM | POA: Diagnosis not present

## 2016-07-23 DIAGNOSIS — Z7984 Long term (current) use of oral hypoglycemic drugs: Secondary | ICD-10-CM | POA: Diagnosis not present

## 2016-07-23 DIAGNOSIS — I129 Hypertensive chronic kidney disease with stage 1 through stage 4 chronic kidney disease, or unspecified chronic kidney disease: Secondary | ICD-10-CM | POA: Diagnosis not present

## 2016-07-23 DIAGNOSIS — Z9181 History of falling: Secondary | ICD-10-CM | POA: Diagnosis not present

## 2016-08-23 DIAGNOSIS — I10 Essential (primary) hypertension: Secondary | ICD-10-CM | POA: Diagnosis not present

## 2016-08-23 DIAGNOSIS — D509 Iron deficiency anemia, unspecified: Secondary | ICD-10-CM | POA: Diagnosis not present

## 2016-08-23 DIAGNOSIS — Z79899 Other long term (current) drug therapy: Secondary | ICD-10-CM | POA: Diagnosis not present

## 2016-08-23 DIAGNOSIS — M7989 Other specified soft tissue disorders: Secondary | ICD-10-CM | POA: Diagnosis not present

## 2016-08-23 DIAGNOSIS — L03119 Cellulitis of unspecified part of limb: Secondary | ICD-10-CM | POA: Diagnosis not present

## 2016-08-24 DIAGNOSIS — I130 Hypertensive heart and chronic kidney disease with heart failure and stage 1 through stage 4 chronic kidney disease, or unspecified chronic kidney disease: Secondary | ICD-10-CM | POA: Diagnosis not present

## 2016-08-24 DIAGNOSIS — N183 Chronic kidney disease, stage 3 (moderate): Secondary | ICD-10-CM | POA: Diagnosis not present

## 2016-08-24 DIAGNOSIS — D631 Anemia in chronic kidney disease: Secondary | ICD-10-CM | POA: Diagnosis not present

## 2016-08-24 DIAGNOSIS — E1122 Type 2 diabetes mellitus with diabetic chronic kidney disease: Secondary | ICD-10-CM | POA: Diagnosis not present

## 2016-08-24 DIAGNOSIS — I5032 Chronic diastolic (congestive) heart failure: Secondary | ICD-10-CM | POA: Diagnosis not present

## 2016-08-24 DIAGNOSIS — R238 Other skin changes: Secondary | ICD-10-CM | POA: Diagnosis not present

## 2016-08-30 DIAGNOSIS — N183 Chronic kidney disease, stage 3 (moderate): Secondary | ICD-10-CM | POA: Diagnosis not present

## 2016-08-30 DIAGNOSIS — I5032 Chronic diastolic (congestive) heart failure: Secondary | ICD-10-CM | POA: Diagnosis not present

## 2016-08-30 DIAGNOSIS — I130 Hypertensive heart and chronic kidney disease with heart failure and stage 1 through stage 4 chronic kidney disease, or unspecified chronic kidney disease: Secondary | ICD-10-CM | POA: Diagnosis not present

## 2016-08-30 DIAGNOSIS — R238 Other skin changes: Secondary | ICD-10-CM | POA: Diagnosis not present

## 2016-08-30 DIAGNOSIS — E1122 Type 2 diabetes mellitus with diabetic chronic kidney disease: Secondary | ICD-10-CM | POA: Diagnosis not present

## 2016-08-30 DIAGNOSIS — D631 Anemia in chronic kidney disease: Secondary | ICD-10-CM | POA: Diagnosis not present

## 2016-09-01 DIAGNOSIS — R238 Other skin changes: Secondary | ICD-10-CM | POA: Diagnosis not present

## 2016-09-01 DIAGNOSIS — D631 Anemia in chronic kidney disease: Secondary | ICD-10-CM | POA: Diagnosis not present

## 2016-09-01 DIAGNOSIS — I130 Hypertensive heart and chronic kidney disease with heart failure and stage 1 through stage 4 chronic kidney disease, or unspecified chronic kidney disease: Secondary | ICD-10-CM | POA: Diagnosis not present

## 2016-09-01 DIAGNOSIS — N183 Chronic kidney disease, stage 3 (moderate): Secondary | ICD-10-CM | POA: Diagnosis not present

## 2016-09-01 DIAGNOSIS — E1122 Type 2 diabetes mellitus with diabetic chronic kidney disease: Secondary | ICD-10-CM | POA: Diagnosis not present

## 2016-09-01 DIAGNOSIS — I5032 Chronic diastolic (congestive) heart failure: Secondary | ICD-10-CM | POA: Diagnosis not present

## 2016-09-05 DIAGNOSIS — N183 Chronic kidney disease, stage 3 (moderate): Secondary | ICD-10-CM | POA: Diagnosis not present

## 2016-09-05 DIAGNOSIS — E1122 Type 2 diabetes mellitus with diabetic chronic kidney disease: Secondary | ICD-10-CM | POA: Diagnosis not present

## 2016-09-05 DIAGNOSIS — I5032 Chronic diastolic (congestive) heart failure: Secondary | ICD-10-CM | POA: Diagnosis not present

## 2016-09-05 DIAGNOSIS — R238 Other skin changes: Secondary | ICD-10-CM | POA: Diagnosis not present

## 2016-09-05 DIAGNOSIS — D631 Anemia in chronic kidney disease: Secondary | ICD-10-CM | POA: Diagnosis not present

## 2016-09-05 DIAGNOSIS — I130 Hypertensive heart and chronic kidney disease with heart failure and stage 1 through stage 4 chronic kidney disease, or unspecified chronic kidney disease: Secondary | ICD-10-CM | POA: Diagnosis not present

## 2016-09-06 DIAGNOSIS — E1165 Type 2 diabetes mellitus with hyperglycemia: Secondary | ICD-10-CM | POA: Diagnosis not present

## 2016-09-06 DIAGNOSIS — R600 Localized edema: Secondary | ICD-10-CM | POA: Diagnosis not present

## 2016-09-06 DIAGNOSIS — E785 Hyperlipidemia, unspecified: Secondary | ICD-10-CM | POA: Diagnosis not present

## 2016-09-06 DIAGNOSIS — I10 Essential (primary) hypertension: Secondary | ICD-10-CM | POA: Diagnosis not present

## 2016-09-14 DIAGNOSIS — R238 Other skin changes: Secondary | ICD-10-CM | POA: Diagnosis not present

## 2016-09-14 DIAGNOSIS — E1122 Type 2 diabetes mellitus with diabetic chronic kidney disease: Secondary | ICD-10-CM | POA: Diagnosis not present

## 2016-09-14 DIAGNOSIS — N183 Chronic kidney disease, stage 3 (moderate): Secondary | ICD-10-CM | POA: Diagnosis not present

## 2016-09-14 DIAGNOSIS — I130 Hypertensive heart and chronic kidney disease with heart failure and stage 1 through stage 4 chronic kidney disease, or unspecified chronic kidney disease: Secondary | ICD-10-CM | POA: Diagnosis not present

## 2016-09-14 DIAGNOSIS — I5032 Chronic diastolic (congestive) heart failure: Secondary | ICD-10-CM | POA: Diagnosis not present

## 2016-09-14 DIAGNOSIS — D631 Anemia in chronic kidney disease: Secondary | ICD-10-CM | POA: Diagnosis not present

## 2016-09-18 DIAGNOSIS — Z79899 Other long term (current) drug therapy: Secondary | ICD-10-CM | POA: Diagnosis not present

## 2016-09-18 DIAGNOSIS — I10 Essential (primary) hypertension: Secondary | ICD-10-CM | POA: Diagnosis not present

## 2016-09-18 DIAGNOSIS — E441 Mild protein-calorie malnutrition: Secondary | ICD-10-CM | POA: Diagnosis not present

## 2016-09-18 DIAGNOSIS — L03119 Cellulitis of unspecified part of limb: Secondary | ICD-10-CM | POA: Diagnosis not present

## 2016-09-18 DIAGNOSIS — E785 Hyperlipidemia, unspecified: Secondary | ICD-10-CM | POA: Diagnosis not present

## 2016-09-18 DIAGNOSIS — D509 Iron deficiency anemia, unspecified: Secondary | ICD-10-CM | POA: Diagnosis not present

## 2016-09-18 DIAGNOSIS — E1165 Type 2 diabetes mellitus with hyperglycemia: Secondary | ICD-10-CM | POA: Diagnosis not present

## 2016-09-18 DIAGNOSIS — M7989 Other specified soft tissue disorders: Secondary | ICD-10-CM | POA: Diagnosis not present

## 2016-09-19 DIAGNOSIS — I130 Hypertensive heart and chronic kidney disease with heart failure and stage 1 through stage 4 chronic kidney disease, or unspecified chronic kidney disease: Secondary | ICD-10-CM | POA: Diagnosis not present

## 2016-09-19 DIAGNOSIS — R238 Other skin changes: Secondary | ICD-10-CM | POA: Diagnosis not present

## 2016-09-19 DIAGNOSIS — E1122 Type 2 diabetes mellitus with diabetic chronic kidney disease: Secondary | ICD-10-CM | POA: Diagnosis not present

## 2016-09-19 DIAGNOSIS — N183 Chronic kidney disease, stage 3 (moderate): Secondary | ICD-10-CM | POA: Diagnosis not present

## 2016-09-19 DIAGNOSIS — D631 Anemia in chronic kidney disease: Secondary | ICD-10-CM | POA: Diagnosis not present

## 2016-09-19 DIAGNOSIS — I5032 Chronic diastolic (congestive) heart failure: Secondary | ICD-10-CM | POA: Diagnosis not present

## 2016-09-21 DIAGNOSIS — E1122 Type 2 diabetes mellitus with diabetic chronic kidney disease: Secondary | ICD-10-CM | POA: Diagnosis not present

## 2016-09-21 DIAGNOSIS — N183 Chronic kidney disease, stage 3 (moderate): Secondary | ICD-10-CM | POA: Diagnosis not present

## 2016-09-21 DIAGNOSIS — I130 Hypertensive heart and chronic kidney disease with heart failure and stage 1 through stage 4 chronic kidney disease, or unspecified chronic kidney disease: Secondary | ICD-10-CM | POA: Diagnosis not present

## 2016-09-21 DIAGNOSIS — I5032 Chronic diastolic (congestive) heart failure: Secondary | ICD-10-CM | POA: Diagnosis not present

## 2016-09-21 DIAGNOSIS — D631 Anemia in chronic kidney disease: Secondary | ICD-10-CM | POA: Diagnosis not present

## 2016-09-21 DIAGNOSIS — R238 Other skin changes: Secondary | ICD-10-CM | POA: Diagnosis not present

## 2016-09-26 DIAGNOSIS — N183 Chronic kidney disease, stage 3 (moderate): Secondary | ICD-10-CM | POA: Diagnosis not present

## 2016-09-26 DIAGNOSIS — R238 Other skin changes: Secondary | ICD-10-CM | POA: Diagnosis not present

## 2016-09-26 DIAGNOSIS — I130 Hypertensive heart and chronic kidney disease with heart failure and stage 1 through stage 4 chronic kidney disease, or unspecified chronic kidney disease: Secondary | ICD-10-CM | POA: Diagnosis not present

## 2016-09-26 DIAGNOSIS — D509 Iron deficiency anemia, unspecified: Secondary | ICD-10-CM | POA: Diagnosis not present

## 2016-09-26 DIAGNOSIS — D631 Anemia in chronic kidney disease: Secondary | ICD-10-CM | POA: Diagnosis not present

## 2016-09-26 DIAGNOSIS — E1122 Type 2 diabetes mellitus with diabetic chronic kidney disease: Secondary | ICD-10-CM | POA: Diagnosis not present

## 2016-09-26 DIAGNOSIS — I5032 Chronic diastolic (congestive) heart failure: Secondary | ICD-10-CM | POA: Diagnosis not present

## 2016-10-04 DIAGNOSIS — N183 Chronic kidney disease, stage 3 (moderate): Secondary | ICD-10-CM | POA: Diagnosis not present

## 2016-10-04 DIAGNOSIS — D509 Iron deficiency anemia, unspecified: Secondary | ICD-10-CM | POA: Diagnosis not present

## 2016-10-04 DIAGNOSIS — E1122 Type 2 diabetes mellitus with diabetic chronic kidney disease: Secondary | ICD-10-CM | POA: Diagnosis not present

## 2016-10-04 DIAGNOSIS — I130 Hypertensive heart and chronic kidney disease with heart failure and stage 1 through stage 4 chronic kidney disease, or unspecified chronic kidney disease: Secondary | ICD-10-CM | POA: Diagnosis not present

## 2016-10-04 DIAGNOSIS — R238 Other skin changes: Secondary | ICD-10-CM | POA: Diagnosis not present

## 2016-10-04 DIAGNOSIS — D631 Anemia in chronic kidney disease: Secondary | ICD-10-CM | POA: Diagnosis not present

## 2016-10-04 DIAGNOSIS — I5032 Chronic diastolic (congestive) heart failure: Secondary | ICD-10-CM | POA: Diagnosis not present

## 2016-10-06 DIAGNOSIS — D509 Iron deficiency anemia, unspecified: Secondary | ICD-10-CM | POA: Diagnosis not present

## 2016-11-01 DIAGNOSIS — M150 Primary generalized (osteo)arthritis: Secondary | ICD-10-CM | POA: Diagnosis not present

## 2016-11-01 DIAGNOSIS — E441 Mild protein-calorie malnutrition: Secondary | ICD-10-CM | POA: Diagnosis not present

## 2016-11-01 DIAGNOSIS — J40 Bronchitis, not specified as acute or chronic: Secondary | ICD-10-CM | POA: Diagnosis not present

## 2016-11-01 DIAGNOSIS — E1165 Type 2 diabetes mellitus with hyperglycemia: Secondary | ICD-10-CM | POA: Diagnosis not present

## 2016-11-01 DIAGNOSIS — R062 Wheezing: Secondary | ICD-10-CM | POA: Diagnosis not present

## 2016-11-01 DIAGNOSIS — M7989 Other specified soft tissue disorders: Secondary | ICD-10-CM | POA: Diagnosis not present

## 2016-11-01 DIAGNOSIS — Z79899 Other long term (current) drug therapy: Secondary | ICD-10-CM | POA: Diagnosis not present

## 2016-11-01 DIAGNOSIS — I10 Essential (primary) hypertension: Secondary | ICD-10-CM | POA: Diagnosis not present

## 2016-11-15 DIAGNOSIS — D509 Iron deficiency anemia, unspecified: Secondary | ICD-10-CM | POA: Diagnosis not present

## 2016-12-16 DIAGNOSIS — H524 Presbyopia: Secondary | ICD-10-CM | POA: Diagnosis not present

## 2016-12-16 DIAGNOSIS — H52223 Regular astigmatism, bilateral: Secondary | ICD-10-CM | POA: Diagnosis not present

## 2016-12-16 DIAGNOSIS — H5213 Myopia, bilateral: Secondary | ICD-10-CM | POA: Diagnosis not present

## 2016-12-16 DIAGNOSIS — E119 Type 2 diabetes mellitus without complications: Secondary | ICD-10-CM | POA: Diagnosis not present

## 2016-12-16 DIAGNOSIS — I10 Essential (primary) hypertension: Secondary | ICD-10-CM | POA: Diagnosis not present

## 2016-12-16 DIAGNOSIS — H353131 Nonexudative age-related macular degeneration, bilateral, early dry stage: Secondary | ICD-10-CM | POA: Diagnosis not present

## 2016-12-16 DIAGNOSIS — Z961 Presence of intraocular lens: Secondary | ICD-10-CM | POA: Diagnosis not present

## 2016-12-16 DIAGNOSIS — H02402 Unspecified ptosis of left eyelid: Secondary | ICD-10-CM | POA: Diagnosis not present

## 2016-12-18 DIAGNOSIS — E1165 Type 2 diabetes mellitus with hyperglycemia: Secondary | ICD-10-CM | POA: Diagnosis not present

## 2016-12-18 DIAGNOSIS — I10 Essential (primary) hypertension: Secondary | ICD-10-CM | POA: Diagnosis not present

## 2016-12-18 DIAGNOSIS — M7989 Other specified soft tissue disorders: Secondary | ICD-10-CM | POA: Diagnosis not present

## 2016-12-18 DIAGNOSIS — J302 Other seasonal allergic rhinitis: Secondary | ICD-10-CM | POA: Diagnosis not present

## 2016-12-18 DIAGNOSIS — E785 Hyperlipidemia, unspecified: Secondary | ICD-10-CM | POA: Diagnosis not present

## 2016-12-18 DIAGNOSIS — M150 Primary generalized (osteo)arthritis: Secondary | ICD-10-CM | POA: Diagnosis not present

## 2016-12-18 DIAGNOSIS — M5136 Other intervertebral disc degeneration, lumbar region: Secondary | ICD-10-CM | POA: Diagnosis not present

## 2016-12-27 DIAGNOSIS — N189 Chronic kidney disease, unspecified: Secondary | ICD-10-CM | POA: Diagnosis not present

## 2016-12-27 DIAGNOSIS — D631 Anemia in chronic kidney disease: Secondary | ICD-10-CM | POA: Diagnosis not present

## 2016-12-29 DIAGNOSIS — I10 Essential (primary) hypertension: Secondary | ICD-10-CM | POA: Diagnosis not present

## 2017-01-08 DIAGNOSIS — D631 Anemia in chronic kidney disease: Secondary | ICD-10-CM | POA: Diagnosis not present

## 2017-01-08 DIAGNOSIS — N189 Chronic kidney disease, unspecified: Secondary | ICD-10-CM | POA: Diagnosis not present

## 2017-01-22 DIAGNOSIS — D509 Iron deficiency anemia, unspecified: Secondary | ICD-10-CM | POA: Diagnosis not present

## 2017-01-22 DIAGNOSIS — E441 Mild protein-calorie malnutrition: Secondary | ICD-10-CM | POA: Diagnosis not present

## 2017-01-22 DIAGNOSIS — M150 Primary generalized (osteo)arthritis: Secondary | ICD-10-CM | POA: Diagnosis not present

## 2017-01-22 DIAGNOSIS — E1165 Type 2 diabetes mellitus with hyperglycemia: Secondary | ICD-10-CM | POA: Diagnosis not present

## 2017-01-22 DIAGNOSIS — M7989 Other specified soft tissue disorders: Secondary | ICD-10-CM | POA: Diagnosis not present

## 2017-01-22 DIAGNOSIS — Z79899 Other long term (current) drug therapy: Secondary | ICD-10-CM | POA: Diagnosis not present

## 2017-01-22 DIAGNOSIS — I10 Essential (primary) hypertension: Secondary | ICD-10-CM | POA: Diagnosis not present

## 2017-01-22 DIAGNOSIS — E785 Hyperlipidemia, unspecified: Secondary | ICD-10-CM | POA: Diagnosis not present

## 2017-02-01 DIAGNOSIS — N189 Chronic kidney disease, unspecified: Secondary | ICD-10-CM | POA: Diagnosis not present

## 2017-02-01 DIAGNOSIS — D631 Anemia in chronic kidney disease: Secondary | ICD-10-CM | POA: Diagnosis not present

## 2017-03-22 DIAGNOSIS — I351 Nonrheumatic aortic (valve) insufficiency: Secondary | ICD-10-CM | POA: Diagnosis not present

## 2017-03-22 DIAGNOSIS — I10 Essential (primary) hypertension: Secondary | ICD-10-CM | POA: Diagnosis not present

## 2017-03-22 DIAGNOSIS — I340 Nonrheumatic mitral (valve) insufficiency: Secondary | ICD-10-CM | POA: Diagnosis not present

## 2017-03-22 DIAGNOSIS — I440 Atrioventricular block, first degree: Secondary | ICD-10-CM | POA: Diagnosis not present

## 2017-03-22 DIAGNOSIS — I313 Pericardial effusion (noninflammatory): Secondary | ICD-10-CM | POA: Diagnosis not present

## 2017-03-22 DIAGNOSIS — R0602 Shortness of breath: Secondary | ICD-10-CM | POA: Diagnosis not present

## 2017-04-03 DIAGNOSIS — D509 Iron deficiency anemia, unspecified: Secondary | ICD-10-CM | POA: Diagnosis not present

## 2017-04-03 DIAGNOSIS — D631 Anemia in chronic kidney disease: Secondary | ICD-10-CM | POA: Diagnosis not present

## 2017-04-03 DIAGNOSIS — N189 Chronic kidney disease, unspecified: Secondary | ICD-10-CM | POA: Diagnosis not present

## 2017-04-04 DIAGNOSIS — D631 Anemia in chronic kidney disease: Secondary | ICD-10-CM | POA: Diagnosis not present

## 2017-04-04 DIAGNOSIS — N189 Chronic kidney disease, unspecified: Secondary | ICD-10-CM | POA: Diagnosis not present

## 2017-04-17 DIAGNOSIS — R600 Localized edema: Secondary | ICD-10-CM | POA: Diagnosis not present

## 2017-04-17 DIAGNOSIS — R05 Cough: Secondary | ICD-10-CM | POA: Diagnosis not present

## 2017-04-17 DIAGNOSIS — R0989 Other specified symptoms and signs involving the circulatory and respiratory systems: Secondary | ICD-10-CM | POA: Diagnosis not present

## 2017-04-17 DIAGNOSIS — I10 Essential (primary) hypertension: Secondary | ICD-10-CM | POA: Diagnosis not present

## 2017-04-17 DIAGNOSIS — E1165 Type 2 diabetes mellitus with hyperglycemia: Secondary | ICD-10-CM | POA: Diagnosis not present

## 2017-04-24 DIAGNOSIS — R42 Dizziness and giddiness: Secondary | ICD-10-CM | POA: Diagnosis not present

## 2017-04-24 DIAGNOSIS — M7989 Other specified soft tissue disorders: Secondary | ICD-10-CM | POA: Diagnosis not present

## 2017-04-24 DIAGNOSIS — I10 Essential (primary) hypertension: Secondary | ICD-10-CM | POA: Diagnosis not present

## 2017-04-24 DIAGNOSIS — E1165 Type 2 diabetes mellitus with hyperglycemia: Secondary | ICD-10-CM | POA: Diagnosis not present

## 2017-04-24 DIAGNOSIS — M5136 Other intervertebral disc degeneration, lumbar region: Secondary | ICD-10-CM | POA: Diagnosis not present

## 2017-04-24 DIAGNOSIS — E441 Mild protein-calorie malnutrition: Secondary | ICD-10-CM | POA: Diagnosis not present

## 2017-04-24 DIAGNOSIS — R0989 Other specified symptoms and signs involving the circulatory and respiratory systems: Secondary | ICD-10-CM | POA: Diagnosis not present

## 2017-05-04 DIAGNOSIS — R609 Edema, unspecified: Secondary | ICD-10-CM | POA: Diagnosis not present

## 2017-05-04 DIAGNOSIS — R600 Localized edema: Secondary | ICD-10-CM | POA: Diagnosis not present

## 2017-05-04 DIAGNOSIS — R0602 Shortness of breath: Secondary | ICD-10-CM | POA: Diagnosis not present

## 2017-05-04 DIAGNOSIS — I10 Essential (primary) hypertension: Secondary | ICD-10-CM | POA: Diagnosis not present

## 2017-05-04 DIAGNOSIS — I351 Nonrheumatic aortic (valve) insufficiency: Secondary | ICD-10-CM | POA: Diagnosis not present

## 2017-05-04 DIAGNOSIS — E559 Vitamin D deficiency, unspecified: Secondary | ICD-10-CM | POA: Diagnosis not present

## 2017-05-04 DIAGNOSIS — Z7984 Long term (current) use of oral hypoglycemic drugs: Secondary | ICD-10-CM | POA: Diagnosis not present

## 2017-05-04 DIAGNOSIS — J90 Pleural effusion, not elsewhere classified: Secondary | ICD-10-CM | POA: Diagnosis not present

## 2017-05-04 DIAGNOSIS — E7800 Pure hypercholesterolemia, unspecified: Secondary | ICD-10-CM | POA: Diagnosis not present

## 2017-05-04 DIAGNOSIS — I340 Nonrheumatic mitral (valve) insufficiency: Secondary | ICD-10-CM | POA: Diagnosis not present

## 2017-05-04 DIAGNOSIS — E119 Type 2 diabetes mellitus without complications: Secondary | ICD-10-CM | POA: Diagnosis not present

## 2017-05-04 DIAGNOSIS — I509 Heart failure, unspecified: Secondary | ICD-10-CM | POA: Diagnosis not present

## 2017-05-04 DIAGNOSIS — I313 Pericardial effusion (noninflammatory): Secondary | ICD-10-CM | POA: Diagnosis not present

## 2017-05-07 DIAGNOSIS — N189 Chronic kidney disease, unspecified: Secondary | ICD-10-CM | POA: Diagnosis not present

## 2017-05-07 DIAGNOSIS — D631 Anemia in chronic kidney disease: Secondary | ICD-10-CM | POA: Diagnosis not present

## 2017-05-08 DIAGNOSIS — I313 Pericardial effusion (noninflammatory): Secondary | ICD-10-CM | POA: Diagnosis not present

## 2017-05-08 DIAGNOSIS — I340 Nonrheumatic mitral (valve) insufficiency: Secondary | ICD-10-CM | POA: Diagnosis not present

## 2017-05-08 DIAGNOSIS — I10 Essential (primary) hypertension: Secondary | ICD-10-CM | POA: Diagnosis not present

## 2017-05-08 DIAGNOSIS — R609 Edema, unspecified: Secondary | ICD-10-CM | POA: Diagnosis not present

## 2017-05-08 DIAGNOSIS — I351 Nonrheumatic aortic (valve) insufficiency: Secondary | ICD-10-CM | POA: Diagnosis not present

## 2017-05-09 DIAGNOSIS — J90 Pleural effusion, not elsewhere classified: Secondary | ICD-10-CM | POA: Diagnosis not present

## 2017-05-09 DIAGNOSIS — M7989 Other specified soft tissue disorders: Secondary | ICD-10-CM | POA: Diagnosis not present

## 2017-05-09 DIAGNOSIS — E1165 Type 2 diabetes mellitus with hyperglycemia: Secondary | ICD-10-CM | POA: Diagnosis not present

## 2017-05-09 DIAGNOSIS — I10 Essential (primary) hypertension: Secondary | ICD-10-CM | POA: Diagnosis not present

## 2017-05-09 DIAGNOSIS — I509 Heart failure, unspecified: Secondary | ICD-10-CM | POA: Diagnosis not present

## 2017-05-14 DIAGNOSIS — I130 Hypertensive heart and chronic kidney disease with heart failure and stage 1 through stage 4 chronic kidney disease, or unspecified chronic kidney disease: Secondary | ICD-10-CM | POA: Diagnosis not present

## 2017-05-14 DIAGNOSIS — I5040 Unspecified combined systolic (congestive) and diastolic (congestive) heart failure: Secondary | ICD-10-CM | POA: Diagnosis not present

## 2017-05-14 DIAGNOSIS — R05 Cough: Secondary | ICD-10-CM | POA: Diagnosis not present

## 2017-05-14 DIAGNOSIS — K219 Gastro-esophageal reflux disease without esophagitis: Secondary | ICD-10-CM | POA: Diagnosis not present

## 2017-05-14 DIAGNOSIS — E1142 Type 2 diabetes mellitus with diabetic polyneuropathy: Secondary | ICD-10-CM | POA: Diagnosis not present

## 2017-05-14 DIAGNOSIS — R0602 Shortness of breath: Secondary | ICD-10-CM | POA: Diagnosis not present

## 2017-05-14 DIAGNOSIS — D631 Anemia in chronic kidney disease: Secondary | ICD-10-CM | POA: Diagnosis not present

## 2017-05-14 DIAGNOSIS — I5033 Acute on chronic diastolic (congestive) heart failure: Secondary | ICD-10-CM | POA: Diagnosis not present

## 2017-05-14 DIAGNOSIS — R0989 Other specified symptoms and signs involving the circulatory and respiratory systems: Secondary | ICD-10-CM | POA: Diagnosis not present

## 2017-05-14 DIAGNOSIS — D72829 Elevated white blood cell count, unspecified: Secondary | ICD-10-CM | POA: Diagnosis not present

## 2017-05-14 DIAGNOSIS — I491 Atrial premature depolarization: Secondary | ICD-10-CM | POA: Diagnosis not present

## 2017-05-14 DIAGNOSIS — I10 Essential (primary) hypertension: Secondary | ICD-10-CM | POA: Diagnosis not present

## 2017-05-14 DIAGNOSIS — E1165 Type 2 diabetes mellitus with hyperglycemia: Secondary | ICD-10-CM | POA: Diagnosis not present

## 2017-05-14 DIAGNOSIS — N183 Chronic kidney disease, stage 3 (moderate): Secondary | ICD-10-CM | POA: Diagnosis not present

## 2017-05-14 DIAGNOSIS — N189 Chronic kidney disease, unspecified: Secondary | ICD-10-CM | POA: Diagnosis not present

## 2017-05-14 DIAGNOSIS — Z9049 Acquired absence of other specified parts of digestive tract: Secondary | ICD-10-CM | POA: Diagnosis not present

## 2017-05-14 DIAGNOSIS — E1122 Type 2 diabetes mellitus with diabetic chronic kidney disease: Secondary | ICD-10-CM | POA: Diagnosis not present

## 2017-05-14 DIAGNOSIS — J984 Other disorders of lung: Secondary | ICD-10-CM | POA: Diagnosis not present

## 2017-05-14 DIAGNOSIS — I509 Heart failure, unspecified: Secondary | ICD-10-CM | POA: Diagnosis not present

## 2017-05-14 DIAGNOSIS — R918 Other nonspecific abnormal finding of lung field: Secondary | ICD-10-CM | POA: Diagnosis not present

## 2017-05-14 DIAGNOSIS — J189 Pneumonia, unspecified organism: Secondary | ICD-10-CM | POA: Diagnosis not present

## 2017-05-14 DIAGNOSIS — K5730 Diverticulosis of large intestine without perforation or abscess without bleeding: Secondary | ICD-10-CM | POA: Diagnosis not present

## 2017-05-14 DIAGNOSIS — J80 Acute respiratory distress syndrome: Secondary | ICD-10-CM | POA: Diagnosis not present

## 2017-05-14 DIAGNOSIS — J690 Pneumonitis due to inhalation of food and vomit: Secondary | ICD-10-CM | POA: Diagnosis not present

## 2017-05-14 DIAGNOSIS — I129 Hypertensive chronic kidney disease with stage 1 through stage 4 chronic kidney disease, or unspecified chronic kidney disease: Secondary | ICD-10-CM | POA: Diagnosis not present

## 2017-05-14 DIAGNOSIS — J168 Pneumonia due to other specified infectious organisms: Secondary | ICD-10-CM | POA: Diagnosis not present

## 2017-05-14 DIAGNOSIS — J181 Lobar pneumonia, unspecified organism: Secondary | ICD-10-CM | POA: Diagnosis not present

## 2017-05-14 DIAGNOSIS — I5032 Chronic diastolic (congestive) heart failure: Secondary | ICD-10-CM | POA: Diagnosis not present

## 2017-05-14 DIAGNOSIS — Z90710 Acquired absence of both cervix and uterus: Secondary | ICD-10-CM | POA: Diagnosis not present

## 2017-05-14 DIAGNOSIS — E8809 Other disorders of plasma-protein metabolism, not elsewhere classified: Secondary | ICD-10-CM | POA: Diagnosis not present

## 2017-05-14 DIAGNOSIS — R601 Generalized edema: Secondary | ICD-10-CM | POA: Diagnosis not present

## 2017-05-14 DIAGNOSIS — Z86010 Personal history of colonic polyps: Secondary | ICD-10-CM | POA: Diagnosis not present

## 2017-05-14 DIAGNOSIS — R1312 Dysphagia, oropharyngeal phase: Secondary | ICD-10-CM | POA: Diagnosis not present

## 2017-05-21 DIAGNOSIS — I5032 Chronic diastolic (congestive) heart failure: Secondary | ICD-10-CM | POA: Diagnosis not present

## 2017-05-21 DIAGNOSIS — I130 Hypertensive heart and chronic kidney disease with heart failure and stage 1 through stage 4 chronic kidney disease, or unspecified chronic kidney disease: Secondary | ICD-10-CM | POA: Diagnosis not present

## 2017-05-21 DIAGNOSIS — D72829 Elevated white blood cell count, unspecified: Secondary | ICD-10-CM | POA: Diagnosis not present

## 2017-05-21 DIAGNOSIS — E1165 Type 2 diabetes mellitus with hyperglycemia: Secondary | ICD-10-CM | POA: Diagnosis not present

## 2017-05-21 DIAGNOSIS — Z658 Other specified problems related to psychosocial circumstances: Secondary | ICD-10-CM | POA: Diagnosis not present

## 2017-05-21 DIAGNOSIS — N183 Chronic kidney disease, stage 3 (moderate): Secondary | ICD-10-CM | POA: Diagnosis not present

## 2017-05-21 DIAGNOSIS — R918 Other nonspecific abnormal finding of lung field: Secondary | ICD-10-CM | POA: Diagnosis not present

## 2017-05-21 DIAGNOSIS — I5040 Unspecified combined systolic (congestive) and diastolic (congestive) heart failure: Secondary | ICD-10-CM | POA: Diagnosis not present

## 2017-05-21 DIAGNOSIS — I10 Essential (primary) hypertension: Secondary | ICD-10-CM | POA: Diagnosis not present

## 2017-05-21 DIAGNOSIS — R05 Cough: Secondary | ICD-10-CM | POA: Diagnosis not present

## 2017-05-21 DIAGNOSIS — I129 Hypertensive chronic kidney disease with stage 1 through stage 4 chronic kidney disease, or unspecified chronic kidney disease: Secondary | ICD-10-CM | POA: Diagnosis not present

## 2017-05-21 DIAGNOSIS — R0602 Shortness of breath: Secondary | ICD-10-CM | POA: Diagnosis not present

## 2017-05-21 DIAGNOSIS — J181 Lobar pneumonia, unspecified organism: Secondary | ICD-10-CM | POA: Diagnosis not present

## 2017-05-21 DIAGNOSIS — K5730 Diverticulosis of large intestine without perforation or abscess without bleeding: Secondary | ICD-10-CM | POA: Diagnosis not present

## 2017-05-21 DIAGNOSIS — I5033 Acute on chronic diastolic (congestive) heart failure: Secondary | ICD-10-CM | POA: Diagnosis not present

## 2017-05-21 DIAGNOSIS — F419 Anxiety disorder, unspecified: Secondary | ICD-10-CM | POA: Diagnosis not present

## 2017-05-21 DIAGNOSIS — D631 Anemia in chronic kidney disease: Secondary | ICD-10-CM | POA: Diagnosis not present

## 2017-05-21 DIAGNOSIS — J80 Acute respiratory distress syndrome: Secondary | ICD-10-CM | POA: Diagnosis not present

## 2017-05-21 DIAGNOSIS — J189 Pneumonia, unspecified organism: Secondary | ICD-10-CM | POA: Diagnosis not present

## 2017-05-21 DIAGNOSIS — E1122 Type 2 diabetes mellitus with diabetic chronic kidney disease: Secondary | ICD-10-CM | POA: Diagnosis not present

## 2017-05-21 DIAGNOSIS — E119 Type 2 diabetes mellitus without complications: Secondary | ICD-10-CM | POA: Diagnosis not present

## 2017-05-24 ENCOUNTER — Other Ambulatory Visit: Payer: Self-pay

## 2017-05-24 NOTE — Patient Outreach (Signed)
[place] [facility]) Care Management  05/24/2017  Catherine Lyons 1928/04/28 [id number]   Transition of care  Referral date: 05/23/17 Referral source: Inpatient at [facility]. Discharged on 05/21/17 Insurance: [employer] Attempt #1  Telephone call to patient regarding transition of care referral. Attempted call to listed home number. Unable to reach patient. Message states, "enter remote access code."  Attempted call to listed mobile number. HIPAA compliant message left with call back phone number.   PLAN: RNCM will attempt 2nd telephone outreach within 3 business days.   Quinn Plowman RN,BSN,CCM [facility] Telephonic  [phone]

## 2017-05-25 ENCOUNTER — Other Ambulatory Visit: Payer: Self-pay

## 2017-05-25 DIAGNOSIS — E119 Type 2 diabetes mellitus without complications: Secondary | ICD-10-CM | POA: Diagnosis not present

## 2017-05-25 DIAGNOSIS — J189 Pneumonia, unspecified organism: Secondary | ICD-10-CM | POA: Diagnosis not present

## 2017-05-25 DIAGNOSIS — I10 Essential (primary) hypertension: Secondary | ICD-10-CM | POA: Diagnosis not present

## 2017-05-25 DIAGNOSIS — I5032 Chronic diastolic (congestive) heart failure: Secondary | ICD-10-CM | POA: Diagnosis not present

## 2017-05-25 NOTE — Patient Outreach (Signed)
[place]) Care Management  05/25/2017  Catherine Lyons 07-12-1927 [id number]   Transition of care  Referral date: 05/23/17 Referral source: Inpatient at [facility]. Discharged on 05/21/17 Insurance: [employer] Attempt #2  Telephone call to patient regarding transition of care referral. Attempted call to listed home number. Unable to reach patient. Message states, "enter remote access code."  Attempted call to listed mobile number. HIPAA compliant message left with call back phone number.   PLAN: RNCM will attempt 3rd telephone outreach within 3 business days.   Quinn Plowman RN,BSN,CCM [facility] Telephonic  [phone]

## 2017-05-28 ENCOUNTER — Other Ambulatory Visit: Payer: Self-pay

## 2017-05-28 NOTE — Patient Outreach (Signed)
[facility]) Care Management  05/28/2017  JOURNIE HOWSON [birth year] [id number]  Transition of care  Referral date:05/23/17 Referral source:Inpatient at [facility]. Discharged on 05/21/17 Insurance:[employer] Attempt #3  Telephone call to patient regarding transition of care.  Contact answering phone states patient is in nursing rehab facility.  HIPAA compliant message left with call back phone number.   PLAN;  RNCM will send patient outreach letter to attempt contact.   Quinn Plowman RN,BSN,CCM [facility] Telephonic  [phone]

## 2017-05-31 DIAGNOSIS — J189 Pneumonia, unspecified organism: Secondary | ICD-10-CM | POA: Diagnosis not present

## 2017-05-31 DIAGNOSIS — F419 Anxiety disorder, unspecified: Secondary | ICD-10-CM | POA: Diagnosis not present

## 2017-05-31 DIAGNOSIS — E119 Type 2 diabetes mellitus without complications: Secondary | ICD-10-CM | POA: Diagnosis not present

## 2017-05-31 DIAGNOSIS — I10 Essential (primary) hypertension: Secondary | ICD-10-CM | POA: Diagnosis not present

## 2017-06-01 DIAGNOSIS — I5032 Chronic diastolic (congestive) heart failure: Secondary | ICD-10-CM | POA: Diagnosis not present

## 2017-06-02 DIAGNOSIS — I5032 Chronic diastolic (congestive) heart failure: Secondary | ICD-10-CM | POA: Diagnosis not present

## 2017-06-04 DIAGNOSIS — I5032 Chronic diastolic (congestive) heart failure: Secondary | ICD-10-CM | POA: Diagnosis not present

## 2017-06-05 DIAGNOSIS — I5032 Chronic diastolic (congestive) heart failure: Secondary | ICD-10-CM | POA: Diagnosis not present

## 2017-06-06 DIAGNOSIS — I5032 Chronic diastolic (congestive) heart failure: Secondary | ICD-10-CM | POA: Diagnosis not present

## 2017-06-07 DIAGNOSIS — I10 Essential (primary) hypertension: Secondary | ICD-10-CM | POA: Diagnosis not present

## 2017-06-07 DIAGNOSIS — I5032 Chronic diastolic (congestive) heart failure: Secondary | ICD-10-CM | POA: Diagnosis not present

## 2017-06-07 DIAGNOSIS — J189 Pneumonia, unspecified organism: Secondary | ICD-10-CM | POA: Diagnosis not present

## 2017-06-07 DIAGNOSIS — E119 Type 2 diabetes mellitus without complications: Secondary | ICD-10-CM | POA: Diagnosis not present

## 2017-06-07 DIAGNOSIS — R531 Weakness: Secondary | ICD-10-CM | POA: Diagnosis not present

## 2017-06-08 DIAGNOSIS — I5032 Chronic diastolic (congestive) heart failure: Secondary | ICD-10-CM | POA: Diagnosis not present

## 2017-06-11 DIAGNOSIS — I5032 Chronic diastolic (congestive) heart failure: Secondary | ICD-10-CM | POA: Diagnosis not present

## 2017-06-11 DIAGNOSIS — R531 Weakness: Secondary | ICD-10-CM | POA: Diagnosis not present

## 2017-06-11 DIAGNOSIS — I10 Essential (primary) hypertension: Secondary | ICD-10-CM | POA: Diagnosis not present

## 2017-06-11 DIAGNOSIS — E119 Type 2 diabetes mellitus without complications: Secondary | ICD-10-CM | POA: Diagnosis not present

## 2017-06-13 DIAGNOSIS — I5032 Chronic diastolic (congestive) heart failure: Secondary | ICD-10-CM | POA: Diagnosis not present

## 2017-06-14 DIAGNOSIS — I5032 Chronic diastolic (congestive) heart failure: Secondary | ICD-10-CM | POA: Diagnosis not present

## 2017-06-15 ENCOUNTER — Other Ambulatory Visit: Payer: Self-pay

## 2017-06-15 DIAGNOSIS — I5032 Chronic diastolic (congestive) heart failure: Secondary | ICD-10-CM | POA: Diagnosis not present

## 2017-06-15 NOTE — Patient Outreach (Signed)
[facility]) Care Management  06/15/2017  MARYIAH OLVEY 05/19/1928 [id number]   No response from patient after 3 telephone calls and outreach letter attempt.   PLAN; RNCM will refer patient to care management assistant to close due to being unable to reach.  RNCM will send notification to patients primary MD of closure.   Quinn Plowman RN,BSN,CCM [facility] Telephonic  [phone]

## 2017-06-18 DIAGNOSIS — I5032 Chronic diastolic (congestive) heart failure: Secondary | ICD-10-CM | POA: Diagnosis not present

## 2017-06-22 DIAGNOSIS — I130 Hypertensive heart and chronic kidney disease with heart failure and stage 1 through stage 4 chronic kidney disease, or unspecified chronic kidney disease: Secondary | ICD-10-CM | POA: Diagnosis not present

## 2017-06-22 DIAGNOSIS — I5033 Acute on chronic diastolic (congestive) heart failure: Secondary | ICD-10-CM | POA: Diagnosis not present

## 2017-06-22 DIAGNOSIS — F419 Anxiety disorder, unspecified: Secondary | ICD-10-CM | POA: Diagnosis not present

## 2017-06-22 DIAGNOSIS — D631 Anemia in chronic kidney disease: Secondary | ICD-10-CM | POA: Diagnosis not present

## 2017-06-22 DIAGNOSIS — F0390 Unspecified dementia without behavioral disturbance: Secondary | ICD-10-CM | POA: Diagnosis not present

## 2017-06-22 DIAGNOSIS — N183 Chronic kidney disease, stage 3 (moderate): Secondary | ICD-10-CM | POA: Diagnosis not present

## 2017-06-22 DIAGNOSIS — E1122 Type 2 diabetes mellitus with diabetic chronic kidney disease: Secondary | ICD-10-CM | POA: Diagnosis not present

## 2017-06-22 DIAGNOSIS — M1991 Primary osteoarthritis, unspecified site: Secondary | ICD-10-CM | POA: Diagnosis not present

## 2017-06-25 DIAGNOSIS — I5033 Acute on chronic diastolic (congestive) heart failure: Secondary | ICD-10-CM | POA: Diagnosis not present

## 2017-06-25 DIAGNOSIS — D631 Anemia in chronic kidney disease: Secondary | ICD-10-CM | POA: Diagnosis not present

## 2017-06-25 DIAGNOSIS — F419 Anxiety disorder, unspecified: Secondary | ICD-10-CM | POA: Diagnosis not present

## 2017-06-25 DIAGNOSIS — N183 Chronic kidney disease, stage 3 (moderate): Secondary | ICD-10-CM | POA: Diagnosis not present

## 2017-06-25 DIAGNOSIS — I130 Hypertensive heart and chronic kidney disease with heart failure and stage 1 through stage 4 chronic kidney disease, or unspecified chronic kidney disease: Secondary | ICD-10-CM | POA: Diagnosis not present

## 2017-06-25 DIAGNOSIS — E1122 Type 2 diabetes mellitus with diabetic chronic kidney disease: Secondary | ICD-10-CM | POA: Diagnosis not present

## 2017-06-26 DIAGNOSIS — D631 Anemia in chronic kidney disease: Secondary | ICD-10-CM | POA: Diagnosis not present

## 2017-06-26 DIAGNOSIS — I130 Hypertensive heart and chronic kidney disease with heart failure and stage 1 through stage 4 chronic kidney disease, or unspecified chronic kidney disease: Secondary | ICD-10-CM | POA: Diagnosis not present

## 2017-06-26 DIAGNOSIS — E1122 Type 2 diabetes mellitus with diabetic chronic kidney disease: Secondary | ICD-10-CM | POA: Diagnosis not present

## 2017-06-26 DIAGNOSIS — N183 Chronic kidney disease, stage 3 (moderate): Secondary | ICD-10-CM | POA: Diagnosis not present

## 2017-06-26 DIAGNOSIS — F419 Anxiety disorder, unspecified: Secondary | ICD-10-CM | POA: Diagnosis not present

## 2017-06-26 DIAGNOSIS — I5033 Acute on chronic diastolic (congestive) heart failure: Secondary | ICD-10-CM | POA: Diagnosis not present

## 2017-06-27 DIAGNOSIS — E860 Dehydration: Secondary | ICD-10-CM | POA: Diagnosis not present

## 2017-06-27 DIAGNOSIS — Z79899 Other long term (current) drug therapy: Secondary | ICD-10-CM | POA: Diagnosis not present

## 2017-06-27 DIAGNOSIS — R197 Diarrhea, unspecified: Secondary | ICD-10-CM | POA: Diagnosis not present

## 2017-06-27 DIAGNOSIS — R7989 Other specified abnormal findings of blood chemistry: Secondary | ICD-10-CM | POA: Diagnosis not present

## 2017-06-27 DIAGNOSIS — I959 Hypotension, unspecified: Secondary | ICD-10-CM | POA: Diagnosis not present

## 2017-06-28 DIAGNOSIS — R911 Solitary pulmonary nodule: Secondary | ICD-10-CM | POA: Diagnosis not present

## 2017-06-28 DIAGNOSIS — E1122 Type 2 diabetes mellitus with diabetic chronic kidney disease: Secondary | ICD-10-CM | POA: Diagnosis not present

## 2017-06-28 DIAGNOSIS — I130 Hypertensive heart and chronic kidney disease with heart failure and stage 1 through stage 4 chronic kidney disease, or unspecified chronic kidney disease: Secondary | ICD-10-CM | POA: Diagnosis not present

## 2017-06-28 DIAGNOSIS — E872 Acidosis: Secondary | ICD-10-CM | POA: Diagnosis not present

## 2017-06-28 DIAGNOSIS — J189 Pneumonia, unspecified organism: Secondary | ICD-10-CM | POA: Diagnosis not present

## 2017-06-28 DIAGNOSIS — D638 Anemia in other chronic diseases classified elsewhere: Secondary | ICD-10-CM | POA: Diagnosis not present

## 2017-06-28 DIAGNOSIS — N179 Acute kidney failure, unspecified: Secondary | ICD-10-CM | POA: Diagnosis not present

## 2017-06-28 DIAGNOSIS — R05 Cough: Secondary | ICD-10-CM | POA: Diagnosis not present

## 2017-06-28 DIAGNOSIS — N183 Chronic kidney disease, stage 3 (moderate): Secondary | ICD-10-CM

## 2017-06-28 DIAGNOSIS — E860 Dehydration: Secondary | ICD-10-CM | POA: Diagnosis not present

## 2017-06-28 DIAGNOSIS — I5032 Chronic diastolic (congestive) heart failure: Secondary | ICD-10-CM | POA: Diagnosis not present

## 2017-06-29 DIAGNOSIS — I5032 Chronic diastolic (congestive) heart failure: Secondary | ICD-10-CM | POA: Diagnosis not present

## 2017-06-29 DIAGNOSIS — R911 Solitary pulmonary nodule: Secondary | ICD-10-CM | POA: Diagnosis not present

## 2017-06-29 DIAGNOSIS — N179 Acute kidney failure, unspecified: Secondary | ICD-10-CM | POA: Diagnosis not present

## 2017-06-29 DIAGNOSIS — E1122 Type 2 diabetes mellitus with diabetic chronic kidney disease: Secondary | ICD-10-CM | POA: Diagnosis not present

## 2017-06-29 DIAGNOSIS — N183 Chronic kidney disease, stage 3 (moderate): Secondary | ICD-10-CM | POA: Diagnosis not present

## 2017-06-29 DIAGNOSIS — E860 Dehydration: Secondary | ICD-10-CM | POA: Diagnosis not present

## 2017-06-29 DIAGNOSIS — D638 Anemia in other chronic diseases classified elsewhere: Secondary | ICD-10-CM | POA: Diagnosis not present

## 2017-06-29 DIAGNOSIS — E872 Acidosis: Secondary | ICD-10-CM | POA: Diagnosis not present

## 2017-06-29 DIAGNOSIS — I130 Hypertensive heart and chronic kidney disease with heart failure and stage 1 through stage 4 chronic kidney disease, or unspecified chronic kidney disease: Secondary | ICD-10-CM | POA: Diagnosis not present

## 2017-07-03 DIAGNOSIS — D631 Anemia in chronic kidney disease: Secondary | ICD-10-CM | POA: Diagnosis not present

## 2017-07-03 DIAGNOSIS — N183 Chronic kidney disease, stage 3 (moderate): Secondary | ICD-10-CM | POA: Diagnosis not present

## 2017-07-03 DIAGNOSIS — F419 Anxiety disorder, unspecified: Secondary | ICD-10-CM | POA: Diagnosis not present

## 2017-07-03 DIAGNOSIS — E1122 Type 2 diabetes mellitus with diabetic chronic kidney disease: Secondary | ICD-10-CM | POA: Diagnosis not present

## 2017-07-03 DIAGNOSIS — I130 Hypertensive heart and chronic kidney disease with heart failure and stage 1 through stage 4 chronic kidney disease, or unspecified chronic kidney disease: Secondary | ICD-10-CM | POA: Diagnosis not present

## 2017-07-03 DIAGNOSIS — I5033 Acute on chronic diastolic (congestive) heart failure: Secondary | ICD-10-CM | POA: Diagnosis not present

## 2017-07-04 DIAGNOSIS — R197 Diarrhea, unspecified: Secondary | ICD-10-CM | POA: Diagnosis not present

## 2017-07-04 DIAGNOSIS — M7989 Other specified soft tissue disorders: Secondary | ICD-10-CM | POA: Diagnosis not present

## 2017-07-04 DIAGNOSIS — I509 Heart failure, unspecified: Secondary | ICD-10-CM | POA: Diagnosis not present

## 2017-07-04 DIAGNOSIS — R7989 Other specified abnormal findings of blood chemistry: Secondary | ICD-10-CM | POA: Diagnosis not present

## 2017-07-05 DIAGNOSIS — F419 Anxiety disorder, unspecified: Secondary | ICD-10-CM | POA: Diagnosis not present

## 2017-07-05 DIAGNOSIS — I5033 Acute on chronic diastolic (congestive) heart failure: Secondary | ICD-10-CM | POA: Diagnosis not present

## 2017-07-05 DIAGNOSIS — E1122 Type 2 diabetes mellitus with diabetic chronic kidney disease: Secondary | ICD-10-CM | POA: Diagnosis not present

## 2017-07-05 DIAGNOSIS — D631 Anemia in chronic kidney disease: Secondary | ICD-10-CM | POA: Diagnosis not present

## 2017-07-05 DIAGNOSIS — I130 Hypertensive heart and chronic kidney disease with heart failure and stage 1 through stage 4 chronic kidney disease, or unspecified chronic kidney disease: Secondary | ICD-10-CM | POA: Diagnosis not present

## 2017-07-05 DIAGNOSIS — N183 Chronic kidney disease, stage 3 (moderate): Secondary | ICD-10-CM | POA: Diagnosis not present

## 2017-07-06 DIAGNOSIS — D631 Anemia in chronic kidney disease: Secondary | ICD-10-CM | POA: Diagnosis not present

## 2017-07-06 DIAGNOSIS — I340 Nonrheumatic mitral (valve) insufficiency: Secondary | ICD-10-CM | POA: Diagnosis not present

## 2017-07-06 DIAGNOSIS — I351 Nonrheumatic aortic (valve) insufficiency: Secondary | ICD-10-CM | POA: Diagnosis not present

## 2017-07-06 DIAGNOSIS — I130 Hypertensive heart and chronic kidney disease with heart failure and stage 1 through stage 4 chronic kidney disease, or unspecified chronic kidney disease: Secondary | ICD-10-CM | POA: Diagnosis not present

## 2017-07-06 DIAGNOSIS — R609 Edema, unspecified: Secondary | ICD-10-CM | POA: Diagnosis not present

## 2017-07-06 DIAGNOSIS — F419 Anxiety disorder, unspecified: Secondary | ICD-10-CM | POA: Diagnosis not present

## 2017-07-06 DIAGNOSIS — R197 Diarrhea, unspecified: Secondary | ICD-10-CM | POA: Diagnosis not present

## 2017-07-06 DIAGNOSIS — E1122 Type 2 diabetes mellitus with diabetic chronic kidney disease: Secondary | ICD-10-CM | POA: Diagnosis not present

## 2017-07-06 DIAGNOSIS — I519 Heart disease, unspecified: Secondary | ICD-10-CM | POA: Diagnosis not present

## 2017-07-06 DIAGNOSIS — I5033 Acute on chronic diastolic (congestive) heart failure: Secondary | ICD-10-CM | POA: Diagnosis not present

## 2017-07-06 DIAGNOSIS — N183 Chronic kidney disease, stage 3 (moderate): Secondary | ICD-10-CM | POA: Diagnosis not present

## 2017-07-06 DIAGNOSIS — I10 Essential (primary) hypertension: Secondary | ICD-10-CM | POA: Diagnosis not present

## 2017-07-06 DIAGNOSIS — R918 Other nonspecific abnormal finding of lung field: Secondary | ICD-10-CM | POA: Diagnosis not present

## 2017-07-06 DIAGNOSIS — I110 Hypertensive heart disease with heart failure: Secondary | ICD-10-CM | POA: Diagnosis not present

## 2017-07-06 DIAGNOSIS — I313 Pericardial effusion (noninflammatory): Secondary | ICD-10-CM | POA: Diagnosis not present

## 2017-07-09 DIAGNOSIS — E1122 Type 2 diabetes mellitus with diabetic chronic kidney disease: Secondary | ICD-10-CM | POA: Diagnosis not present

## 2017-07-09 DIAGNOSIS — D631 Anemia in chronic kidney disease: Secondary | ICD-10-CM | POA: Diagnosis not present

## 2017-07-09 DIAGNOSIS — I130 Hypertensive heart and chronic kidney disease with heart failure and stage 1 through stage 4 chronic kidney disease, or unspecified chronic kidney disease: Secondary | ICD-10-CM | POA: Diagnosis not present

## 2017-07-09 DIAGNOSIS — N183 Chronic kidney disease, stage 3 (moderate): Secondary | ICD-10-CM | POA: Diagnosis not present

## 2017-07-09 DIAGNOSIS — I5033 Acute on chronic diastolic (congestive) heart failure: Secondary | ICD-10-CM | POA: Diagnosis not present

## 2017-07-09 DIAGNOSIS — F419 Anxiety disorder, unspecified: Secondary | ICD-10-CM | POA: Diagnosis not present

## 2017-07-10 DIAGNOSIS — D509 Iron deficiency anemia, unspecified: Secondary | ICD-10-CM | POA: Diagnosis not present

## 2017-07-10 DIAGNOSIS — D631 Anemia in chronic kidney disease: Secondary | ICD-10-CM | POA: Diagnosis not present

## 2017-07-10 DIAGNOSIS — R918 Other nonspecific abnormal finding of lung field: Secondary | ICD-10-CM | POA: Diagnosis not present

## 2017-07-10 DIAGNOSIS — N189 Chronic kidney disease, unspecified: Secondary | ICD-10-CM | POA: Diagnosis not present

## 2017-07-11 DIAGNOSIS — D631 Anemia in chronic kidney disease: Secondary | ICD-10-CM | POA: Diagnosis not present

## 2017-07-11 DIAGNOSIS — F419 Anxiety disorder, unspecified: Secondary | ICD-10-CM | POA: Diagnosis not present

## 2017-07-11 DIAGNOSIS — E1122 Type 2 diabetes mellitus with diabetic chronic kidney disease: Secondary | ICD-10-CM | POA: Diagnosis not present

## 2017-07-11 DIAGNOSIS — I5033 Acute on chronic diastolic (congestive) heart failure: Secondary | ICD-10-CM | POA: Diagnosis not present

## 2017-07-11 DIAGNOSIS — I130 Hypertensive heart and chronic kidney disease with heart failure and stage 1 through stage 4 chronic kidney disease, or unspecified chronic kidney disease: Secondary | ICD-10-CM | POA: Diagnosis not present

## 2017-07-11 DIAGNOSIS — N183 Chronic kidney disease, stage 3 (moderate): Secondary | ICD-10-CM | POA: Diagnosis not present

## 2017-07-12 DIAGNOSIS — E1122 Type 2 diabetes mellitus with diabetic chronic kidney disease: Secondary | ICD-10-CM | POA: Diagnosis not present

## 2017-07-12 DIAGNOSIS — F419 Anxiety disorder, unspecified: Secondary | ICD-10-CM | POA: Diagnosis not present

## 2017-07-12 DIAGNOSIS — I5033 Acute on chronic diastolic (congestive) heart failure: Secondary | ICD-10-CM | POA: Diagnosis not present

## 2017-07-12 DIAGNOSIS — N183 Chronic kidney disease, stage 3 (moderate): Secondary | ICD-10-CM | POA: Diagnosis not present

## 2017-07-12 DIAGNOSIS — I130 Hypertensive heart and chronic kidney disease with heart failure and stage 1 through stage 4 chronic kidney disease, or unspecified chronic kidney disease: Secondary | ICD-10-CM | POA: Diagnosis not present

## 2017-07-12 DIAGNOSIS — D631 Anemia in chronic kidney disease: Secondary | ICD-10-CM | POA: Diagnosis not present

## 2017-07-15 DIAGNOSIS — I5033 Acute on chronic diastolic (congestive) heart failure: Secondary | ICD-10-CM | POA: Diagnosis not present

## 2017-07-15 DIAGNOSIS — E1122 Type 2 diabetes mellitus with diabetic chronic kidney disease: Secondary | ICD-10-CM | POA: Diagnosis not present

## 2017-07-15 DIAGNOSIS — M1991 Primary osteoarthritis, unspecified site: Secondary | ICD-10-CM | POA: Diagnosis not present

## 2017-07-15 DIAGNOSIS — F419 Anxiety disorder, unspecified: Secondary | ICD-10-CM | POA: Diagnosis not present

## 2017-07-15 DIAGNOSIS — D510 Vitamin B12 deficiency anemia due to intrinsic factor deficiency: Secondary | ICD-10-CM | POA: Diagnosis not present

## 2017-07-15 DIAGNOSIS — I130 Hypertensive heart and chronic kidney disease with heart failure and stage 1 through stage 4 chronic kidney disease, or unspecified chronic kidney disease: Secondary | ICD-10-CM | POA: Diagnosis not present

## 2017-07-15 DIAGNOSIS — F0390 Unspecified dementia without behavioral disturbance: Secondary | ICD-10-CM | POA: Diagnosis not present

## 2017-07-15 DIAGNOSIS — N183 Chronic kidney disease, stage 3 (moderate): Secondary | ICD-10-CM | POA: Diagnosis not present

## 2017-07-15 DIAGNOSIS — D631 Anemia in chronic kidney disease: Secondary | ICD-10-CM | POA: Diagnosis not present

## 2017-07-16 DIAGNOSIS — E119 Type 2 diabetes mellitus without complications: Secondary | ICD-10-CM | POA: Diagnosis not present

## 2017-07-16 DIAGNOSIS — N179 Acute kidney failure, unspecified: Secondary | ICD-10-CM | POA: Diagnosis not present

## 2017-07-16 DIAGNOSIS — E860 Dehydration: Secondary | ICD-10-CM | POA: Diagnosis not present

## 2017-07-16 DIAGNOSIS — N189 Chronic kidney disease, unspecified: Secondary | ICD-10-CM | POA: Diagnosis not present

## 2017-07-16 DIAGNOSIS — M109 Gout, unspecified: Secondary | ICD-10-CM | POA: Diagnosis not present

## 2017-07-16 DIAGNOSIS — R531 Weakness: Secondary | ICD-10-CM | POA: Diagnosis not present

## 2017-07-16 DIAGNOSIS — R918 Other nonspecific abnormal finding of lung field: Secondary | ICD-10-CM | POA: Diagnosis not present

## 2017-07-16 DIAGNOSIS — I130 Hypertensive heart and chronic kidney disease with heart failure and stage 1 through stage 4 chronic kidney disease, or unspecified chronic kidney disease: Secondary | ICD-10-CM | POA: Diagnosis not present

## 2017-07-16 DIAGNOSIS — R600 Localized edema: Secondary | ICD-10-CM | POA: Diagnosis not present

## 2017-07-16 DIAGNOSIS — I509 Heart failure, unspecified: Secondary | ICD-10-CM | POA: Diagnosis not present

## 2017-07-17 DIAGNOSIS — R918 Other nonspecific abnormal finding of lung field: Secondary | ICD-10-CM | POA: Diagnosis not present

## 2017-07-17 DIAGNOSIS — E860 Dehydration: Secondary | ICD-10-CM | POA: Diagnosis not present

## 2017-07-17 DIAGNOSIS — E119 Type 2 diabetes mellitus without complications: Secondary | ICD-10-CM | POA: Diagnosis not present

## 2017-07-17 DIAGNOSIS — M109 Gout, unspecified: Secondary | ICD-10-CM | POA: Diagnosis not present

## 2017-07-17 DIAGNOSIS — R0602 Shortness of breath: Secondary | ICD-10-CM | POA: Diagnosis not present

## 2017-07-17 DIAGNOSIS — I509 Heart failure, unspecified: Secondary | ICD-10-CM | POA: Diagnosis not present

## 2017-07-17 DIAGNOSIS — N179 Acute kidney failure, unspecified: Secondary | ICD-10-CM | POA: Diagnosis not present

## 2017-07-19 DIAGNOSIS — Z6820 Body mass index (BMI) 20.0-20.9, adult: Secondary | ICD-10-CM | POA: Diagnosis not present

## 2017-07-19 DIAGNOSIS — R8271 Bacteriuria: Secondary | ICD-10-CM | POA: Diagnosis not present

## 2017-07-19 DIAGNOSIS — N179 Acute kidney failure, unspecified: Secondary | ICD-10-CM | POA: Diagnosis not present

## 2017-07-19 DIAGNOSIS — R627 Adult failure to thrive: Secondary | ICD-10-CM | POA: Diagnosis not present

## 2017-07-19 DIAGNOSIS — I5033 Acute on chronic diastolic (congestive) heart failure: Secondary | ICD-10-CM | POA: Diagnosis not present

## 2017-07-19 DIAGNOSIS — E872 Acidosis: Secondary | ICD-10-CM | POA: Diagnosis not present

## 2017-07-19 DIAGNOSIS — E43 Unspecified severe protein-calorie malnutrition: Secondary | ICD-10-CM | POA: Diagnosis not present

## 2017-07-19 DIAGNOSIS — I130 Hypertensive heart and chronic kidney disease with heart failure and stage 1 through stage 4 chronic kidney disease, or unspecified chronic kidney disease: Secondary | ICD-10-CM | POA: Diagnosis not present

## 2017-07-19 DIAGNOSIS — Z515 Encounter for palliative care: Secondary | ICD-10-CM | POA: Diagnosis not present

## 2017-07-20 DEATH — deceased

## 2017-07-23 ENCOUNTER — Other Ambulatory Visit: Payer: Self-pay

## 2017-07-23 NOTE — Patient Outreach (Signed)
[place] [facility]) Care Management  07/23/2017  KEIKO MYRICKS 1927/10/30 [id number]  Transition of care  Referral date: 07/19/17 Referral source: discharged from [place] care 06/21/2017 Insurance: [employer]  Attempt #1  Telephone call to patient regarding transition of care follow up. Unable to reach patient. HIPAA compliant voice message left with call back phone number.   PLAN: RNCM will attempt 2nd telephone call to patient within 3 business days.   Quinn Plowman RN,BSN,CCM [facility] Telephonic  [phone]

## 2017-07-24 ENCOUNTER — Other Ambulatory Visit: Payer: Self-pay

## 2017-07-24 NOTE — Patient Outreach (Signed)
[facility]) Care Management  07/24/2017  Catherine Lyons 04/29/1928 [id number]   Received voice mail message from patients daughter in law stating patient is deceased.   PLAN; RNCM will refer patient to care management assistant to close patient.  RNCM will send notification to patients listed primary MD of closure.  Quinn Plowman RN,BSN,CCM [facility] Telephonic  [phone]

## 2017-07-24 NOTE — Patient Outreach (Signed)
[facility]) Care Management  07/24/2017  KEYARRA RENDALL [birth year] [id number]  Transition of care  Referral date: 07/19/17 Referral source: discharged from [place] care 07/09/2017 Insurance: [employer]  Attempt #2  Telephone call to patient regarding transition of care follow up. Unable to reach patient. HIPAA compliant voice message left with call back phone number.   PLAN: RNCM will attempt 3rd telephone call to patient within 3 business days.   Quinn Plowman RN,BSN,CCM [facility] Telephonic  [phone]

## 2017-07-25 ENCOUNTER — Ambulatory Visit: Payer: Self-pay
